# Patient Record
Sex: Female | Born: 2000
Health system: Southern US, Community
[De-identification: ages and names within clinical notes are randomized; demographics above are authoritative.]

## PROBLEM LIST (undated history)

## (undated) DIAGNOSIS — A64 Unspecified sexually transmitted disease: Secondary | ICD-10-CM

## (undated) DIAGNOSIS — Z8619 Personal history of other infectious and parasitic diseases: Secondary | ICD-10-CM

## (undated) HISTORY — DX: Unspecified sexually transmitted disease: A64

## (undated) HISTORY — PX: WISDOM TOOTH EXTRACTION: SHX21

## (undated) HISTORY — DX: Personal history of other infectious and parasitic diseases: Z86.19

---
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
## 2018-01-22 ENCOUNTER — Ambulatory Visit (INDEPENDENT_AMBULATORY_CARE_PROVIDER_SITE_OTHER): Payer: BLUE CROSS/BLUE SHIELD | Admitting: Family Medicine

## 2018-01-22 ENCOUNTER — Other Ambulatory Visit: Payer: Self-pay

## 2018-01-22 ENCOUNTER — Encounter: Payer: Self-pay | Admitting: Family Medicine

## 2018-01-22 VITALS — BP 110/78 | HR 56 | Temp 98.1°F | Resp 17 | Ht 61.0 in | Wt 112.5 lb

## 2018-01-22 DIAGNOSIS — Z0000 Encounter for general adult medical examination without abnormal findings: Secondary | ICD-10-CM

## 2018-01-22 DIAGNOSIS — Z23 Encounter for immunization: Secondary | ICD-10-CM | POA: Diagnosis not present

## 2018-01-22 NOTE — Patient Instructions (Signed)
Follow up in 1 year or as needed We can do your 2nd Men B shot at a nurse visit any time after the new year Keep up the good work!  You look great! Call with any questions or concerns Happy Holidays!!!

## 2018-01-22 NOTE — Progress Notes (Signed)
   Subjective:    Patient ID: Tara MaskerHailey R Matsushima, female    DOB: January 10, 2001, 17 y.o.   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  No concerns regarding health.  Currently dating and feels safe in relationship.  No concerns for STDs- declines testing.  Pt is due for Men B.  No drug use.  Occasional alcohol.  Not vaping or smoking   Review of Systems Patient reports no vision/ hearing changes, adenopathy,fever, weight change,  persistant/recurrent hoarseness , swallowing issues, chest pain, palpitations, edema, persistant/recurrent cough, hemoptysis, dyspnea (rest/exertional/paroxysmal nocturnal), gastrointestinal bleeding (melena, rectal bleeding), abdominal pain, significant heartburn, bowel changes, GU symptoms (dysuria, hematuria, incontinence), Gyn symptoms (abnormal  bleeding, pain),  syncope, focal weakness, memory loss, numbness & tingling, skin/hair/nail changes, abnormal bruising or bleeding, anxiety, or depression.     Objective:   Physical Exam General Appearance:    Alert, cooperative, no distress, appears stated age  Head:    Normocephalic, without obvious abnormality, atraumatic  Eyes:    PERRL, conjunctiva/corneas clear, EOM's intact, fundi    benign, both eyes  Ears:    Normal TM's and external ear canals, both ears  Nose:   Nares normal, septum midline, mucosa normal, no drainage    or sinus tenderness  Throat:   Lips, mucosa, and tongue normal; teeth and gums normal  Neck:   Supple, symmetrical, trachea midline, no adenopathy;    Thyroid: no enlargement/tenderness/nodules  Back:     Symmetric, no curvature, ROM normal, no CVA tenderness  Lungs:     Clear to auscultation bilaterally, respirations unlabored  Chest Wall:    No tenderness or deformity   Heart:    Regular rate and rhythm, S1 and S2 normal, no murmur, rub   or gallop  Breast Exam:    Deferred to GYN  Abdomen:     Soft, non-tender, bowel sounds active all four quadrants,    no  masses, no organomegaly  Genitalia:    Deferred to GYN  Rectal:    Extremities:   Extremities normal, atraumatic, no cyanosis or edema  Pulses:   2+ and symmetric all extremities  Skin:   Skin color, texture, turgor normal, no rashes or lesions  Lymph nodes:   Cervical, supraclavicular, and axillary nodes normal  Neurologic:   CNII-XII intact, normal strength, sensation and reflexes    throughout          Assessment & Plan:

## 2018-01-22 NOTE — Addendum Note (Signed)
Addended by: Geannie RisenBRODMERKEL, JESSICA L on: 01/22/2018 02:27 PM   Modules accepted: Orders

## 2018-01-22 NOTE — Assessment & Plan Note (Signed)
Pt's PE WNL.  UTD on GYN.  Men B given today.  No need for labs today.  Anticipatory guidance provided.

## 2018-04-21 DIAGNOSIS — Z113 Encounter for screening for infections with a predominantly sexual mode of transmission: Secondary | ICD-10-CM | POA: Diagnosis not present

## 2018-04-21 DIAGNOSIS — N760 Acute vaginitis: Secondary | ICD-10-CM | POA: Diagnosis not present

## 2018-06-02 ENCOUNTER — Ambulatory Visit (INDEPENDENT_AMBULATORY_CARE_PROVIDER_SITE_OTHER): Payer: BLUE CROSS/BLUE SHIELD | Admitting: Family Medicine

## 2018-06-02 ENCOUNTER — Other Ambulatory Visit: Payer: Self-pay

## 2018-06-02 ENCOUNTER — Encounter: Payer: Self-pay | Admitting: Family Medicine

## 2018-06-02 DIAGNOSIS — N3000 Acute cystitis without hematuria: Secondary | ICD-10-CM

## 2018-06-02 MED ORDER — NITROFURANTOIN MONOHYD MACRO 100 MG PO CAPS: 100.0000 mg | ORAL_CAPSULE | Freq: Two times a day (BID) | ORAL | 0 refills | Status: DC

## 2018-06-02 NOTE — Progress Notes (Signed)
I have discussed the procedure for the virtual visit with the patient who has given consent to proceed with assessment and treatment.   Zyair Rhein S Gaynelle Pastrana, CMA     

## 2018-06-02 NOTE — Patient Instructions (Signed)
Please follow up if symptoms do not improve or as needed.  Seek an urgent care if your symptoms are worsening so they can check your urine.    Urinary Tract Infection, Adult  A urinary tract infection (UTI) is an infection of any part of the urinary tract. The urinary tract includes the kidneys, ureters, bladder, and urethra. These organs make, store, and get rid of urine in the body. Your health care provider may use other names to describe the infection. An upper UTI affects the ureters and kidneys (pyelonephritis). A lower UTI affects the bladder (cystitis) and urethra (urethritis). What are the causes? Most urinary tract infections are caused by bacteria in your genital area, around the entrance to your urinary tract (urethra). These bacteria grow and cause inflammation of your urinary tract. What increases the risk? You are more likely to develop this condition if:  You have a urinary catheter that stays in place (indwelling).  You are not able to control when you urinate or have a bowel movement (you have incontinence).  You are female and you: ? Use a spermicide or diaphragm for birth control. ? Have low estrogen levels. ? Are pregnant.  You have certain genes that increase your risk (genetics).  You are sexually active.  You take antibiotic medicines.  You have a condition that causes your flow of urine to slow down, such as: ? An enlarged prostate, if you are female. ? Blockage in your urethra (stricture). ? A kidney stone. ? A nerve condition that affects your bladder control (neurogenic bladder). ? Not getting enough to drink, or not urinating often.  You have certain medical conditions, such as: ? Diabetes. ? A weak disease-fighting system (immunesystem). ? Sickle cell disease. ? Gout. ? Spinal cord injury. What are the signs or symptoms? Symptoms of this condition include:  Needing to urinate right away (urgently).  Frequent urination or passing small amounts  of urine frequently.  Pain or burning with urination.  Blood in the urine.  Urine that smells bad or unusual.  Trouble urinating.  Cloudy urine.  Vaginal discharge, if you are female.  Pain in the abdomen or the lower back. You may also have:  Vomiting or a decreased appetite.  Confusion.  Irritability or tiredness.  A fever.  Diarrhea. The first symptom in older adults may be confusion. In some cases, they may not have any symptoms until the infection has worsened. How is this diagnosed? This condition is diagnosed based on your medical history and a physical exam. You may also have other tests, including:  Urine tests.  Blood tests.  Tests for sexually transmitted infections (STIs). If you have had more than one UTI, a cystoscopy or imaging studies may be done to determine the cause of the infections. How is this treated? Treatment for this condition includes:  Antibiotic medicine.  Over-the-counter medicines to treat discomfort.  Drinking enough water to stay hydrated. If you have frequent infections or have other conditions such as a kidney stone, you may need to see a health care provider who specializes in the urinary tract (urologist). In rare cases, urinary tract infections can cause sepsis. Sepsis is a life-threatening condition that occurs when the body responds to an infection. Sepsis is treated in the hospital with IV antibiotics, fluids, and other medicines. Follow these instructions at home:  Medicines  Take over-the-counter and prescription medicines only as told by your health care provider.  If you were prescribed an antibiotic medicine, take it as told  by your health care provider. Do not stop using the antibiotic even if you start to feel better. General instructions  Make sure you: ? Empty your bladder often and completely. Do not hold urine for long periods of time. ? Empty your bladder after sex. ? Wipe from front to back after a bowel  movement if you are female. Use each tissue one time when you wipe.  Drink enough fluid to keep your urine pale yellow.  Keep all follow-up visits as told by your health care provider. This is important. Contact a health care provider if:  Your symptoms do not get better after 1-2 days.  Your symptoms go away and then return. Get help right away if you have:  Severe pain in your back or your lower abdomen.  A fever.  Nausea or vomiting. Summary  A urinary tract infection (UTI) is an infection of any part of the urinary tract, which includes the kidneys, ureters, bladder, and urethra.  Most urinary tract infections are caused by bacteria in your genital area, around the entrance to your urinary tract (urethra).  Treatment for this condition often includes antibiotic medicines.  If you were prescribed an antibiotic medicine, take it as told by your health care provider. Do not stop using the antibiotic even if you start to feel better.  Keep all follow-up visits as told by your health care provider. This is important. This information is not intended to replace advice given to you by your health care provider. Make sure you discuss any questions you have with your health care provider. Document Released: 11/22/2004 Document Revised: 08/22/2017 Document Reviewed: 08/22/2017 Elsevier Interactive Patient Education  2019 [employer].

## 2018-06-02 NOTE — Progress Notes (Signed)
     Virtual Visit via Video Note  Subjective  CC:  Chief Complaint  Patient presents with  . Urinary Tract Infection    Symptoms started 2 days. C/o buring and lower abdominal pain.    HPI:  I connected with Tara Ramirez on 06/02/18 at  1:00 PM EDT by a video enabled telemedicine application and verified that I am speaking with the correct person using two identifiers. Location patient: [place], college apartment Location provider: [employer], Office Persons participating in the virtual visit: [name], Willow Ora, MD [name], CMA   I discussed the limitations of evaluation and management by telemedicine and the availability of in person appointments. The patient expressed understanding and agreed to proceed. 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 First episode. No gyn sxs including vaginal d/c or pelvic pain. Eating fine. Feels otherwise well. She is in [place] so can't leave me a urine specimen. Trying to avoid urgent care due to covid -19 pandemic.   Assessment  1. Acute cystitis without hematuria      Plan   UTI:  Clinically consistent. macrobid x 5 days. Counseled on expectations and need for follow up if not improving. Patient understands and agrees with care plan. She doesn't report any upper tract sxs.   I discussed the assessment and treatment plan with the patient. The patient was provided an opportunity to ask questions and all were answered. The patient agreed with the plan and demonstrated an understanding of the instructions.   The patient was advised to call back or seek an in-person evaluation if the symptoms worsen or if the condition fails to improve as anticipated. Follow up: Return if symptoms worsen or fail to improve.  Visit date not found  Meds ordered this encounter   Medications  . nitrofurantoin, macrocrystal-monohydrate, (MACROBID) 100 MG capsule    Sig: Take 1 capsule (100 mg total) by mouth 2 (two) times daily.    Dispense:  10 capsule    Refill:  0      I reviewed the patients updated PMH, FH, and SocHx.    Patient Active Problem List   Diagnosis Date Noted  . Physical exam 10/11/2016  . COLLES' FRACTURE, RIGHT WRIST 01/05/2008   Current Meds  Medication Sig  . Norethindrone Acetate-Ethinyl Estrad-FE (JUNEL FE 24) 1-20 MG-MCG(24) tablet Take 1 tablet by mouth daily.    Allergies: Patient is allergic to penicillins. Family History: Patient family history includes Diabetes in her mother; Diabetes (age of onset: 23) in her brother; Esophageal cancer in her paternal grandfather; Thyroid disease in her mother. Social History:  Patient  reports that she has never smoked. She has never used smokeless tobacco. She reports current alcohol use. She reports that she does not use drugs.  Review of Systems: Constitutional: Negative for fever malaise or anorexia Cardiovascular: negative for chest pain Respiratory: negative for SOB or persistent cough Gastrointestinal: negative for abdominal pain  OBJECTIVE Vitals: There were no vitals taken for this visit.reports she is afebrile General: no acute distress , A&Ox3, appears well  Willow Ora, MD

## 2018-07-10 ENCOUNTER — Telehealth: Payer: BLUE CROSS/BLUE SHIELD | Admitting: Physician Assistant

## 2018-07-10 DIAGNOSIS — N760 Acute vaginitis: Secondary | ICD-10-CM | POA: Diagnosis not present

## 2018-07-10 DIAGNOSIS — B9689 Other specified bacterial agents as the cause of diseases classified elsewhere: Secondary | ICD-10-CM

## 2018-07-10 MED ORDER — METRONIDAZOLE 500 MG PO TABS: 500.0000 mg | ORAL_TABLET | Freq: Two times a day (BID) | ORAL | 0 refills | Status: DC

## 2018-07-10 NOTE — Progress Notes (Signed)

## 2018-07-10 NOTE — Progress Notes (Signed)
I have spent 5 minutes in review of e-visit questionnaire, review and updating patient chart, medical decision making and response to patient.   Vale Peraza Cody Nataline Basara, PA-C    

## 2018-10-14 ENCOUNTER — Other Ambulatory Visit: Payer: Self-pay

## 2018-10-14 MED ORDER — JUNEL FE 24 1-20 MG-MCG(24) PO TABS: 1.0000 | ORAL_TABLET | Freq: Every day | ORAL | 1 refills | Status: DC

## 2018-10-14 NOTE — Telephone Encounter (Signed)
Medication refill request: Junel Last AEX:  10/10/2017 Next AEX: 02/11/2019 Last MMG (if hormonal medication request): N/A Refill authorized: Please advise, order pended for 90 day with 1 refill if authorized. Pt's annual is not until December due to school schedule.

## 2018-10-26 DIAGNOSIS — Z20828 Contact with and (suspected) exposure to other viral communicable diseases: Secondary | ICD-10-CM | POA: Diagnosis not present

## 2018-12-26 DIAGNOSIS — Z113 Encounter for screening for infections with a predominantly sexual mode of transmission: Secondary | ICD-10-CM | POA: Diagnosis not present

## 2018-12-29 DIAGNOSIS — Z20828 Contact with and (suspected) exposure to other viral communicable diseases: Secondary | ICD-10-CM | POA: Diagnosis not present

## 2019-01-09 ENCOUNTER — Telehealth: Payer: BLUE CROSS/BLUE SHIELD | Admitting: Physician Assistant

## 2019-01-09 DIAGNOSIS — N760 Acute vaginitis: Secondary | ICD-10-CM

## 2019-01-09 MED ORDER — METRONIDAZOLE 500 MG PO TABS: 500.0000 mg | ORAL_TABLET | Freq: Two times a day (BID) | ORAL | 0 refills | Status: DC

## 2019-01-09 NOTE — Progress Notes (Signed)
I spent more than 5 min, but less than on this E-Visit.   We are sorry that you are not feeling well. Here is how we plan to help! Based on what you shared with me it looks like you: May have a vaginosis due to bacteria  Vaginosis is an inflammation of the vagina that can result in discharge, itching and pain. The cause is usually a change in the normal balance of vaginal bacteria or an infection. Vaginosis can also result from reduced estrogen levels after menopause.  The most common causes of vaginosis are:   Bacterial vaginosis which results from an overgrowth of one on several organisms that are normally present in your vagina.   Yeast infections which are caused by a naturally occurring fungus called candida.   Vaginal atrophy (atrophic vaginosis) which results from the thinning of the vagina from reduced estrogen levels after menopause.   Trichomoniasis which is caused by a parasite and is commonly transmitted by sexual intercourse.  Factors that increase your risk of developing vaginosis include: Marland Kitchen Medications, such as antibiotics and steroids . Uncontrolled diabetes . Use of hygiene products such as bubble bath, vaginal spray or vaginal deodorant . Douching . Wearing damp or tight-fitting clothing . Using an intrauterine device (IUD) for birth control . Hormonal changes, such as those associated with pregnancy, birth control pills or menopause . Sexual activity . Having a sexually transmitted infection  Your treatment plan is Metronidazole or Flagyl 500mg  twice a day for 7 days.  I have electronically sent this prescription into the pharmacy that you have chosen.  Be sure to take all of the medication as directed. Stop taking any medication if you develop a rash, tongue swelling or shortness of breath. Mothers who are breast feeding should consider pumping and discarding their breast milk while on these antibiotics. However, there is no consensus that infant exposure at  these doses would be harmful.  Remember that medication creams can weaken latex condoms.   HOME CARE:  Good hygiene may prevent some types of vaginosis from recurring and may relieve some symptoms:  . Avoid baths, hot tubs and whirlpool spas. Rinse soap from your outer genital area after a shower, and dry the area well to prevent irritation. Don't use scented or harsh soaps, such as those with deodorant or antibacterial action. Marland Kitchen Avoid irritants. These include scented tampons and pads. . Wipe from front to back after using the toilet. Doing so avoids spreading fecal bacteria to your vagina.  Other things that may help prevent vaginosis include:  Marland Kitchen Don't douche. Your vagina doesn't require cleansing other than normal bathing. Repetitive douching disrupts the normal organisms that reside in the vagina and can actually increase your risk of vaginal infection. Douching won't clear up a vaginal infection. . Use a latex condom. Both female and female latex condoms may help you avoid infections spread by sexual contact. . Wear cotton underwear. Also wear pantyhose with a cotton crotch. If you feel comfortable without it, skip wearing underwear to bed. Yeast thrives in Marland Kitchen Your symptoms should improve in the next day or two.  GET HELP RIGHT AWAY IF:  . You have pain in your lower abdomen ( pelvic area or over your ovaries) . You develop nausea or vomiting . You develop a fever . Your discharge changes or worsens . You have persistent pain with intercourse . You develop shortness of breath, a rapid pulse, or you faint.  These symptoms could be signs  of problems or infections that need to be evaluated by a medical provider now.  MAKE SURE YOU    Understand these instructions.  Will watch your condition.  Will get help right away if you are not doing well or get worse.  Your e-visit answers were reviewed by a board certified advanced clinical practitioner to complete your  personal care plan. Depending upon the condition, your plan could have included both over the counter or prescription medications. Please review your pharmacy choice to make sure that you have choses a pharmacy that is open for you to pick up any needed prescription, Your safety is important to [place]. If you have drug allergies check your prescription carefully.   You can use MyChart to ask questions about today's visit, request a non-urgent call back, or ask for a work or school excuse for 24 hours related to this e-Visit. If it has been greater than 24 hours you will need to follow up with your provider, or enter a new e-Visit to address those concerns. You will get a MyChart message within the next two days asking about your experience. I hope that your e-visit has been valuable and will speed your recovery.

## 2019-01-09 NOTE — Addendum Note (Signed)
Addended by: Waldon Merl on: 01/09/2019 07:18 PM   Modules accepted: Orders

## 2019-01-19 DIAGNOSIS — Z113 Encounter for screening for infections with a predominantly sexual mode of transmission: Secondary | ICD-10-CM | POA: Diagnosis not present

## 2019-02-09 DIAGNOSIS — Z20828 Contact with and (suspected) exposure to other viral communicable diseases: Secondary | ICD-10-CM | POA: Diagnosis not present

## 2019-02-10 NOTE — Progress Notes (Signed)
18 y.o. G0P0000 Single White or Caucasian Not Hispanic or Latino female here for annual exam.  Sexually active, no current partner. Uses condoms. No pain. On the pill, doing well.   Period Cycle (Days): 28 Period Duration (Days): 4 days Period Pattern: Regular Menstrual Flow: Light, Moderate Menstrual Control: Tampon Menstrual Control Change Freq (Hours): chaning every 4-6 hours Dysmenorrhea: None  Patient's last menstrual period was 01/30/2019 (exact date).          Sexually active: Yes.    The current method of family planning is OCP's and condoms most of the time.    Exercising: Yes.    weights, running Smoker:  no  Health Maintenance: Pap:  none History of abnormal Pap:  no MMG:  none Colonoscopy:  none BMD:   none TDaP:  10/25/2008 Gardasil: 06/09/2013, 05/29/2012, 05/09/2011   reports that she has never smoked. She has never used smokeless tobacco. She reports current alcohol use. She reports that she does not use drugs. Drinks 1 drink one time a week. 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   Past Medical History:  Diagnosis Date  . History of chlamydia   . STD (sexually transmitted disease)     Past Surgical History:  Procedure Laterality Date  . gum graft surgery  09/30/2017  . WISDOM TOOTH EXTRACTION      Current Outpatient Medications  Medication Sig Dispense Refill  . Norethindrone Acetate-Ethinyl Estrad-FE (JUNEL FE 24) 1-20 MG-MCG(24) tablet Take 1 tablet by mouth daily. (Patient not taking: Reported on 02/11/2019) 84 tablet 1   No current facility-administered medications for this visit.    Family History  Problem Relation Age of Onset  . Diabetes Mother        type 1  . Thyroid disease Mother        goiters  . Diabetes Brother 13       type 1  . Esophageal cancer Paternal Grandfather     Review of Systems  Constitutional: Negative.   HENT: Negative.   Eyes: Negative.   Respiratory:  Negative.   Cardiovascular: Negative.   Gastrointestinal: Negative.   Endocrine: Negative.   Genitourinary: Negative.   Musculoskeletal: Negative.   Skin: Negative.   Allergic/Immunologic: Negative.   Neurological: Negative.   Hematological: Negative.   Psychiatric/Behavioral: Negative.     Exam:   BP 130/76 (BP Location: Right Arm, Patient Position: Sitting, Cuff Size: Normal)   Pulse 64   Temp (!) 97.4 F (36.3 C) (Skin)   Wt 118 lb (53.5 kg)   LMP 01/30/2019 (Exact Date)   BMI 22.30 kg/m   Weight change: @WEIGHTCHANGE @ Height:      Ht Readings from Last 3 Encounters:  01/22/18 5\' 1"  (1.549 m) (10 %, Z= -1.29)*  10/10/17 5' 0.25" (1.53 m) (6 %, Z= -1.58)*  10/11/16 5\' 1"  (1.549 m) (10 %, Z= -1.27)*   * Growth percentiles are based on CDC (Girls, 2-20 Years) data.    General appearance: alert, cooperative and appears stated age Head: Normocephalic, without obvious abnormality, atraumatic Neck: no adenopathy, supple, symmetrical, trachea midline and thyroid normal to inspection and palpation Lungs: clear to auscultation bilaterally Cardiovascular: regular rate and rhythm Breasts: normal appearance, no masses or tenderness Abdomen: soft, non-tender; non distended,  no masses,  no organomegaly Extremities: extremities normal, atraumatic, no cyanosis or edema Skin: Skin color, texture, turgor normal. No rashes or lesions Lymph nodes: Cervical, supraclavicular, and axillary nodes normal.  No abnormal inguinal nodes palpated Neurologic: Grossly normal   Pelvic: External genitalia:  no lesions              Urethra:  normal appearing urethra with no masses, tenderness or lesions              Bartholins and Skenes: normal                 Vagina: normal appearing vagina with normal color and discharge, no lesions              Cervix: no lesions               Bimanual Exam:  Uterus:  normal size, contour, position, consistency, mobility, non-tender and anteverted               Adnexa: no mass, fullness, tenderness               Rectovaginal: deferred  Kaitlyn Sprague chaperoned for the exam.  A:  Well Woman with normal exam  Doing well on OCP's  H/O Chlamydia last fall, asymptomatic  P:   No pap this year  TDAP today  STD testing  Screening labs  Continue OCP's  Discussed breast self exam  Discussed calcium and vit D intake  Discussed Chlamydia and risk of tubal scarring and ectopic pregnancy, information given

## 2019-02-11 ENCOUNTER — Ambulatory Visit (INDEPENDENT_AMBULATORY_CARE_PROVIDER_SITE_OTHER): Payer: 59 | Admitting: Obstetrics and Gynecology

## 2019-02-11 ENCOUNTER — Other Ambulatory Visit: Payer: Self-pay

## 2019-02-11 ENCOUNTER — Encounter: Payer: Self-pay | Admitting: Obstetrics and Gynecology

## 2019-02-11 VITALS — BP 130/76 | HR 64 | Temp 97.4°F | Wt 118.0 lb

## 2019-02-11 DIAGNOSIS — Z113 Encounter for screening for infections with a predominantly sexual mode of transmission: Secondary | ICD-10-CM

## 2019-02-11 DIAGNOSIS — Z8619 Personal history of other infectious and parasitic diseases: Secondary | ICD-10-CM

## 2019-02-11 DIAGNOSIS — Z23 Encounter for immunization: Secondary | ICD-10-CM

## 2019-02-11 DIAGNOSIS — Z01419 Encounter for gynecological examination (general) (routine) without abnormal findings: Secondary | ICD-10-CM | POA: Diagnosis not present

## 2019-02-11 DIAGNOSIS — Z833 Family history of diabetes mellitus: Secondary | ICD-10-CM | POA: Diagnosis not present

## 2019-02-11 DIAGNOSIS — Z0000 Encounter for general adult medical examination without abnormal findings: Secondary | ICD-10-CM

## 2019-02-11 MED ORDER — JUNEL FE 24 1-20 MG-MCG(24) PO TABS: 1.0000 | ORAL_TABLET | Freq: Every day | ORAL | 3 refills | Status: DC

## 2019-02-11 NOTE — Patient Instructions (Signed)
EXERCISE AND DIET:  We recommended that you start or continue a regular exercise program for good health. Regular exercise means any activity that makes your heart beat faster and makes you sweat.  We recommend exercising at least 30 minutes per day at least 3 days a week, preferably 4 or 5.  We also recommend a diet low in fat and sugar.  Inactivity, poor dietary choices and obesity can cause diabetes, heart attack, stroke, and kidney damage, among others.    ALCOHOL AND SMOKING:  Women should limit their alcohol intake to no more than 7 drinks/beers/glasses of wine (combined, not each!) per week. Moderation of alcohol intake to this level decreases your risk of breast cancer and liver damage. And of course, no recreational drugs are part of a healthy lifestyle.  And absolutely no smoking or even second hand smoke. Most people know smoking can cause heart and lung diseases, but did you know it also contributes to weakening of your bones? Aging of your skin?  Yellowing of your teeth and nails?  CALCIUM AND VITAMIN D:  Adequate intake of calcium and Vitamin D are recommended.  The recommendations for exact amounts of these supplements seem to change often, but generally speaking 1,000 mg of calcium (between diet and supplement) and 800 units of Vitamin D per day seems prudent. Certain women may benefit from higher intake of Vitamin D.  If you are among these women, your doctor will have told you during your visit.    PAP SMEARS:  Pap smears, to check for cervical cancer or precancers,  have traditionally been done yearly, although recent scientific advances have shown that most women can have pap smears less often.  However, every woman still should have a physical exam from her gynecologist every year. It will include a breast check, inspection of the vulva and vagina to check for abnormal growths or skin changes, a visual exam of the cervix, and then an exam to evaluate the size and shape of the uterus and  ovaries.  And after 20 years of age, a rectal exam is indicated to check for rectal cancers. We will also provide age appropriate advice regarding health maintenance, like when you should have certain vaccines, screening for sexually transmitted diseases, bone density testing, colonoscopy, mammograms, etc.   MAMMOGRAMS:  All women over 40 years old should have a yearly mammogram. Many facilities now offer a "3D" mammogram, which may cost around $50 extra out of pocket. If possible,  we recommend you accept the option to have the 3D mammogram performed.  It both reduces the number of women who will be called back for extra views which then turn out to be normal, and it is better than the routine mammogram at detecting truly abnormal areas.    COLON CANCER SCREENING: Now recommend starting at age 45. At this time colonoscopy is not covered for routine screening until 50. There are take home tests that can be done between 45-49.   COLONOSCOPY:  Colonoscopy to screen for colon cancer is recommended for all women at age 50.  We know, you hate the idea of the prep.  We agree, BUT, having colon cancer and not knowing it is worse!!  Colon cancer so often starts as a polyp that can be seen and removed at colonscopy, which can quite literally save your life!  And if your first colonoscopy is normal and you have no family history of colon cancer, most women don't have to have it again for   10 years.  Once every ten years, you can do something that may end up saving your life, right?  We will be happy to help you get it scheduled when you are ready.  Be sure to check your insurance coverage so you understand how much it will cost.  It may be covered as a preventative service at no cost, but you should check your particular policy.      Breast Self-Awareness Breast self-awareness means being familiar with how your breasts look and feel. It involves checking your breasts regularly and reporting any changes to your  health care provider. Practicing breast self-awareness is important. A change in your breasts can be a sign of a serious medical problem. Being familiar with how your breasts look and feel allows you to find any problems early, when treatment is more likely to be successful. All women should practice breast self-awareness, including women who have had breast implants. How to do a breast self-exam One way to learn what is normal for your breasts and whether your breasts are changing is to do a breast self-exam. To do a breast self-exam: Look for Changes  1. Remove all the clothing above your waist. 2. Stand in front of a mirror in a room with good lighting. 3. Put your hands on your hips. 4. Push your hands firmly downward. 5. Compare your breasts in the mirror. Look for differences between them (asymmetry), such as: ? Differences in shape. ? Differences in size. ? Puckers, dips, and bumps in one breast and not the other. 6. Look at each breast for changes in your skin, such as: ? Redness. ? Scaly areas. 7. Look for changes in your nipples, such as: ? Discharge. ? Bleeding. ? Dimpling. ? Redness. ? A change in position. Feel for Changes Carefully feel your breasts for lumps and changes. It is best to do this while lying on your back on the floor and again while sitting or standing in the shower or tub with soapy water on your skin. Feel each breast in the following way:  Place the arm on the side of the breast you are examining above your head.  Feel your breast with the other hand.  Start in the nipple area and make  inch (2 cm) overlapping circles to feel your breast. Use the pads of your three middle fingers to do this. Apply light pressure, then medium pressure, then firm pressure. The light pressure will allow you to feel the tissue closest to the skin. The medium pressure will allow you to feel the tissue that is a little deeper. The firm pressure will allow you to feel the tissue  close to the ribs.  Continue the overlapping circles, moving downward over the breast until you feel your ribs below your breast.  Move one finger-width toward the center of the body. Continue to use the  inch (2 cm) overlapping circles to feel your breast as you move slowly up toward your collarbone.  Continue the up and down exam using all three pressures until you reach your armpit.  Write Down What You Find  Write down what is normal for each breast and any changes that you find. Keep a written record with breast changes or normal findings for each breast. By writing this information down, you do not need to depend only on memory for size, tenderness, or location. Write down where you are in your menstrual cycle, if you are still menstruating. If you are having trouble noticing differences   in your breasts, do not get discouraged. With time you will become more familiar with the variations in your breasts and more comfortable with the exam. How often should I examine my breasts? Examine your breasts every month. If you are breastfeeding, the best time to examine your breasts is after a feeding or after using a breast pump. If you menstruate, the best time to examine your breasts is 5-7 days after your period is over. During your period, your breasts are lumpier, and it may be more difficult to notice changes. When should I see my health care provider? See your health care provider if you notice:  A change in shape or size of your breasts or nipples.  A change in the skin of your breast or nipples, such as a reddened or scaly area.  Unusual discharge from your nipples.  A lump or thick area that was not there before.  Pain in your breasts.  Anything that concerns you.   Chlamydia, Female Chlamydia is an STD (sexually transmitted disease). It is a bacterial infection that spreads (is contagious) through sexual contact. Chlamydia can occur in different areas of the body,  including:  The tube that moves urine from the bladder out of the body (urethra).  The lower part of the uterus (cervix).  The throat.  The rectum. This condition is not difficult to treat. However, if left untreated, chlamydia can lead to more serious health problems, including pelvic inflammatory disorder (PID). PID can increase your risk of not being able to have children (sterility). Also, if chlamydia is left untreated and you are pregnant or become pregnant, there is a chance that your baby can become infected during delivery. This may cause serious health problems for the baby. What are the causes? Chlamydia is caused by the bacteria Chlamydia trachomatis. It is passed from an infected partner during sexual activity. Chlamydia can spread through contact with the genitals, mouth, or rectum. What are the signs or symptoms? In some cases, there may not be any symptoms for this condition (asymptomatic), especially early in the infection. If symptoms develop, they may include:  Burning with urination.  Frequent urination.  Vaginal discharge.  Redness, soreness, and swelling (inflammation) of the rectum.  Bleeding or discharge from the rectum.  Abdominal pain.  Pain during sexual intercourse.  Bleeding between menstrual periods.  Itching, burning, or redness in the eyes, or discharge from the eyes. How is this diagnosed? This condition may be diagnosed with:  Urine tests.  Swab tests. Depending on your symptoms, your health care provider may use a cotton swab to collect discharge from your vagina or rectum to test for the bacteria.  A pelvic exam. How is this treated? This condition is treated with antibiotic medicines. If you are pregnant, certain types of antibiotics will need to be avoided. Follow these instructions at home: Medicines  Take over-the-counter and prescription medicines only as told by your health care provider.  Take your antibiotic medicine as told  by your health care provider. Do not stop taking the antibiotic even if you start to feel better. Sexual activity  Tell sexual partners about your infection. This includes any oral, anal, or vaginal sex partners you have had within 60 days of when your symptoms started. Sexual partners should also be treated, even if they have no signs of the disease.  Do not have sex until you and your sexual partners have completed treatment and your health care provider says it is okay. If your health   care provider prescribed you a single dose treatment, wait 7 days after taking the treatment before having sex. General instructions  It is your responsibility to get your test results. Ask your health care provider, or the department performing the test, when your results will be ready.  Get plenty of rest.  Eat a healthy, well-balanced diet.  Drink enough fluids to keep your urine clear or pale yellow.  Keep all follow-up visits as told by your health care provider. This is important. You may need to be tested for infection again 3 months after treatment. How is this prevented? The only sure way to prevent chlamydia is to avoid having sex. However, you can lower your risk by:  Using latex condoms correctly every time you have sex.  Not having multiple sexual partners.  Asking if your sexual partner has been tested for STIs and had negative results. Contact a health care provider if:  You develop new symptoms or your symptoms do not get better after completing treatment.  You have a fever or chills.  You have pain during sexual intercourse. Get help right away if:  Your pain gets worse and does not get better with medicine.  You develop flu-like symptoms, such as night sweats, sore throat, or muscle aches.  You experience nausea or vomiting.  You have difficulty swallowing.  You have bleeding between periods or after sex.  You have irregular menstrual periods.  You have abdominal or  lower back pain that does not get better with medicine.  You feel weak or dizzy, or you faint.  You are pregnant and you develop symptoms of chlamydia. Summary  Chlamydia is an STD (sexually transmitted disease). It is a bacterial infection that spreads (is contagious) through sexual contact.  This condition is not difficult to treat, however. If left untreated, chlamydia can lead to more serious health problems, including pelvic inflammatory disease (PID).  In some cases, there may not be any symptoms for this condition (asymptomatic).  This condition is treated with antibiotic medicines.  Using latex condoms correctly every time you have sex can help prevent chlamydia. This information is not intended to replace advice given to you by your health care provider. Make sure you discuss any questions you have with your health care provider. Document Released: 11/22/2004 Document Revised: 08/06/2017 Document Reviewed: 01/30/2016 Elsevier Patient Education  2020 [employer]  

## 2019-02-12 LAB — CBC
Hematocrit: 41.8 % (ref 34.0–46.6)
Hemoglobin: 14.3 g/dL (ref 11.1–15.9)
MCH: 30.6 pg (ref 26.6–33.0)
MCHC: 34.2 g/dL (ref 31.5–35.7)
MCV: 90 fL (ref 79–97)
Platelets: 445 10*3/uL (ref 150–450)
RBC: 4.67 x10E6/uL (ref 3.77–5.28)
RDW: 12 % (ref 11.7–15.4)
WBC: 8.6 10*3/uL (ref 3.4–10.8)

## 2019-02-12 LAB — COMPREHENSIVE METABOLIC PANEL
ALT: 25 IU/L (ref 0–32)
AST: 38 IU/L (ref 0–40)
Albumin/Globulin Ratio: 1.3 (ref 1.2–2.2)
Albumin: 4.1 g/dL (ref 3.9–5.0)
Alkaline Phosphatase: 88 IU/L (ref 39–117)
BUN/Creatinine Ratio: 29 — ABNORMAL HIGH (ref 9–23)
BUN: 20 mg/dL (ref 6–20)
Bilirubin Total: 0.3 mg/dL (ref 0.0–1.2)
CO2: 23 mmol/L (ref 20–29)
Calcium: 9.8 mg/dL (ref 8.7–10.2)
Chloride: 101 mmol/L (ref 96–106)
Creatinine, Ser: 0.68 mg/dL (ref 0.57–1.00)
GFR calc Af Amer: 146 mL/min/{1.73_m2} (ref >59)
GFR calc non Af Amer: 126 mL/min/{1.73_m2} (ref >59)
Globulin, Total: 3.1 g/dL (ref 1.5–4.5)
Glucose: 84 mg/dL (ref 65–99)
Potassium: 4.1 mmol/L (ref 3.5–5.2)
Sodium: 138 mmol/L (ref 134–144)
Total Protein: 7.2 g/dL (ref 6.0–8.5)

## 2019-02-12 LAB — LIPID PANEL
Chol/HDL Ratio: 2.5 ratio (ref 0.0–4.4)
Cholesterol, Total: 206 mg/dL — ABNORMAL HIGH (ref 100–199)
HDL: 84 mg/dL (ref >39)
LDL Chol Calc (NIH): 96 mg/dL (ref 0–99)
Triglycerides: 153 mg/dL — ABNORMAL HIGH (ref 0–149)
VLDL Cholesterol Cal: 26 mg/dL (ref 5–40)

## 2019-02-12 LAB — HEP, RPR, HIV PANEL
HIV Screen 4th Generation wRfx: NONREACTIVE
Hepatitis B Surface Ag: NEGATIVE
RPR Ser Ql: NONREACTIVE

## 2019-02-12 LAB — CHLAMYDIA/GONOCOCCUS/TRICHOMONAS, NAA
Chlamydia by NAA: NEGATIVE
Gonococcus by NAA: NEGATIVE
Trich vag by NAA: NEGATIVE

## 2019-02-12 LAB — HEPATITIS C ANTIBODY: Hep C Virus Ab: 0.1 s/co ratio (ref 0.0–0.9)

## 2019-02-12 LAB — HEMOGLOBIN A1C
Est. average glucose Bld gHb Est-mCnc: 97 mg/dL
Hgb A1c MFr Bld: 5 % (ref 4.8–5.6)

## 2019-02-18 DIAGNOSIS — Z20828 Contact with and (suspected) exposure to other viral communicable diseases: Secondary | ICD-10-CM | POA: Diagnosis not present

## 2019-04-03 ENCOUNTER — Telehealth: Payer: Self-pay | Admitting: Obstetrics and Gynecology

## 2019-04-03 NOTE — Telephone Encounter (Signed)
Rx for Junel Fe 24 sent on 02/11/19, #[id number]  Call placed to [facility], spoke with Marchelle Folks. Confirmed 3 refills on file.   Call returned to patient, advised as seen above. F/u with pharmacy for filling Rx.   Routing to provider for final review. Patient is agreeable to disposition. Will close encounter.

## 2019-04-03 NOTE — Telephone Encounter (Signed)
Patient is calling regarding refill for northindrone. Patient stated that she was given 3 refills in December, but the pharmacy is saying that they need authorization to refill prescription.

## 2019-05-05 DIAGNOSIS — Z23 Encounter for immunization: Secondary | ICD-10-CM | POA: Diagnosis not present

## 2019-05-11 ENCOUNTER — Encounter: Payer: Self-pay | Admitting: Certified Nurse Midwife

## 2019-05-13 ENCOUNTER — Encounter: Payer: Self-pay | Admitting: Certified Nurse Midwife

## 2019-07-06 ENCOUNTER — Telehealth: Payer: Self-pay | Admitting: Obstetrics and Gynecology

## 2019-07-06 NOTE — Telephone Encounter (Signed)
Patient concerned with bleeding after intercourse.

## 2019-07-06 NOTE — Telephone Encounter (Signed)
AEX 01/2019 H/O +Chlamydia per pt, not in Epic Neg CHL/GC 01/2019 OCPs-Junel   Spoke with patient. Pt states having bleeding/spotting during mid intercourse until a couple days after. Pt describes bleeding as starting out red, then to light pink/brown. Pt states has been happening over last couple months. Pt only wearing a panty liner and changing once per day. Pt denies vaginal odor, discharge, fever, chills. Pt states using condoms and is on OCPs. Pt denies skipping or missing any pills.  Pt states currently has 2 partners and the coital bleeding is only occurring intermittently with which partner she has intercourse.  Pt advised to be seen for further evaluation. Pt agreeable.  Pt scheduled for OV with Dr Oscar La on 5/11 at 4pm. Pt verbalized understanding. CPS Neg.   Routing to Dr Oscar La for review.  Encounter closed.

## 2019-07-07 ENCOUNTER — Other Ambulatory Visit: Payer: Self-pay

## 2019-07-07 ENCOUNTER — Other Ambulatory Visit (HOSPITAL_COMMUNITY)
Admission: RE | Admit: 2019-07-07 | Discharge: 2019-07-07 | Disposition: A | Discharge status: Home / Self Care | Payer: 59 | Source: Ambulatory Visit | Attending: Obstetrics and Gynecology | Admitting: Obstetrics and Gynecology

## 2019-07-07 ENCOUNTER — Encounter: Payer: Self-pay | Admitting: Obstetrics and Gynecology

## 2019-07-07 ENCOUNTER — Ambulatory Visit (INDEPENDENT_AMBULATORY_CARE_PROVIDER_SITE_OTHER): Payer: 59 | Admitting: Obstetrics and Gynecology

## 2019-07-07 VITALS — BP 110/62 | HR 68 | Temp 98.8°F | Ht 60.0 in | Wt 122.0 lb

## 2019-07-07 DIAGNOSIS — Z124 Encounter for screening for malignant neoplasm of cervix: Secondary | ICD-10-CM | POA: Insufficient documentation

## 2019-07-07 DIAGNOSIS — N930 Postcoital and contact bleeding: Secondary | ICD-10-CM

## 2019-07-07 DIAGNOSIS — Z113 Encounter for screening for infections with a predominantly sexual mode of transmission: Secondary | ICD-10-CM | POA: Diagnosis not present

## 2019-07-07 DIAGNOSIS — N3090 Cystitis, unspecified without hematuria: Secondary | ICD-10-CM

## 2019-07-07 LAB — POCT URINALYSIS DIPSTICK
Bilirubin, UA: NEGATIVE
Glucose, UA: NEGATIVE
Ketones, UA: NORMAL
Nitrite, UA: NEGATIVE
Protein, UA: POSITIVE — AB
Urobilinogen, UA: NEGATIVE E.U./dL — AB
pH, UA: 6 (ref 5.0–8.0)

## 2019-07-07 MED ORDER — PHENAZOPYRIDINE HCL 200 MG PO TABS
200.0000 mg | ORAL_TABLET | Freq: Three times a day (TID) | ORAL | 0 refills | Status: DC | PRN
Start: 2019-07-07 — End: 2019-07-14

## 2019-07-07 MED ORDER — SULFAMETHOXAZOLE-TRIMETHOPRIM 800-160 MG PO TABS
1.0000 | ORAL_TABLET | Freq: Two times a day (BID) | ORAL | 0 refills | Status: DC
Start: 2019-07-07 — End: 2019-07-14

## 2019-07-07 NOTE — Patient Instructions (Signed)

## 2019-07-07 NOTE — Progress Notes (Signed)
GYNECOLOGY  VISIT   HPI: 19 y.o.   Single White or Caucasian Not Hispanic or Latino  female   G0P0000 with Patient's last menstrual period was 05/20/2019 (approximate).   here for bleeding after sex. Patient states that she sex Sunday evening and there was blood. She says that it continued into Monday and having burning with urination.    She has occasional bleeding after sex for the last 2 years. She has several current partners. She had a new partner and it happened x 1. She bleed on the bed, not painful, no tearing. Using condoms every time.  No vaginitis symptoms.  She c/o dysuria since yesterday, she has had some increased frequency of urination since Sunday, no urgency to void.   She is on OCP's, giving herself a cycle every 2-3 months. Bleeds x 4-5 days, no intermenstrual bleeding. No cramps, saturates a super + tampon in 4-5 hours.   GYNECOLOGIC HISTORY: Patient's last menstrual period was 05/20/2019 (approximate). Contraception: junel  Menopausal hormone therapy: none        OB History    Gravida  0   Para  0   Term  0   Preterm  0   AB  0   Living  0     SAB  0   TAB  0   Ectopic  0   Multiple  0   Live Births  0              Patient Active Problem List   Diagnosis Date Noted  . History of chlamydia   . Physical exam 10/11/2016  . COLLES' FRACTURE, RIGHT WRIST 01/05/2008    Past Medical History:  Diagnosis Date  . History of chlamydia   . STD (sexually transmitted disease)     Past Surgical History:  Procedure Laterality Date  . gum graft surgery  09/30/2017  . WISDOM TOOTH EXTRACTION      Current Outpatient Medications  Medication Sig Dispense Refill  . Norethindrone Acetate-Ethinyl Estrad-FE (JUNEL FE 24) 1-20 MG-MCG(24) tablet Take 1 tablet by mouth daily. 84 tablet 3   No current facility-administered medications for this visit.     ALLERGIES: Penicillins  Family History  Problem Relation Age of Onset  . Diabetes Mother    type 1  . Thyroid disease Mother        goiters  . Diabetes Brother 13       type 1  . Esophageal cancer Paternal Grandfather     Social History   Socioeconomic History  . Marital status: Single    Spouse name: Not on file  . Number of children: Not on file  . Years of education: Not on file  . Highest education level: Not on file  Occupational History  . Not on file  Tobacco Use  . Smoking status: Never Smoker  . Smokeless tobacco: Never Used  Substance and Sexual Activity  . Alcohol use: Yes  . Drug use: No  . Sexual activity: Yes    Partners: Male    Birth control/protection: Condom, Pill  Other Topics Concern  . Not on file  Social History Narrative  . Not on file   Social Determinants of Health   Financial Resource Strain:   . Difficulty of Paying Living Expenses:   Food Insecurity:   . Worried About Charity fundraiser in the Last Year:   . Arboriculturist in the Last Year:   Transportation Needs:   . Lack  of Transportation (Medical):   Marland Kitchen Lack of Transportation (Non-Medical):   Physical Activity:   . Days of Exercise per Week:   . Minutes of Exercise per Session:   Stress:   . Feeling of Stress :   Social Connections:   . Frequency of Communication with Friends and Family:   . Frequency of Social Gatherings with Friends and Family:   . Attends Religious Services:   . Active Member of Clubs or Organizations:   . Attends Banker Meetings:   Marland Kitchen Marital Status:   Intimate Partner Violence:   . Fear of Current or Ex-Partner:   . Emotionally Abused:   Marland Kitchen Physically Abused:   . Sexually Abused:     Review of Systems  Genitourinary: Positive for dysuria and frequency.  All other systems reviewed and are negative.   PHYSICAL EXAMINATION:    BP 110/62   Pulse 68   Temp 98.8 F (37.1 C)   Ht 5' (1.524 m)   Wt 122 lb (55.3 kg)   LMP 05/20/2019 (Approximate)   SpO2 98%   BMI 23.83 kg/m     General appearance: alert, cooperative and  appears stated age   Pelvic: External genitalia:  no lesions              Urethra:  normal appearing urethra with no masses, tenderness or lesions              Bartholins and Skenes: normal                 Vagina: normal appearing vagina with normal color and discharge, no lesions              Cervix: no lesions and friable              Bimanual Exam:  Uterus:  normal size, contour, position, consistency, mobility, non-tender              Adnexa: no mass, fullness, tenderness               Chaperone was present for exam.  ASSESSMENT Post coital bleeding, mildly friable cervix otherwise normal exam UTI    PLAN Pap Nuswab for GC/CT/trich Treat UTI with bactrim and pyridium Send urine for ua, c&s

## 2019-07-10 ENCOUNTER — Encounter: Payer: Self-pay | Admitting: Obstetrics and Gynecology

## 2019-07-10 LAB — CHLAMYDIA/GONOCOCCUS/TRICHOMONAS, NAA
Chlamydia by NAA: NEGATIVE
Gonococcus by NAA: NEGATIVE
Trich vag by NAA: NEGATIVE

## 2019-07-10 LAB — CYTOLOGY - PAP: Diagnosis: NEGATIVE

## 2019-07-13 ENCOUNTER — Other Ambulatory Visit: Payer: Self-pay

## 2019-07-13 NOTE — Telephone Encounter (Signed)
Called and spoke with patient. Scheduled her for 10:45 tomorrow 07/13/19 with Dr. Oscar La. She states that her UTI is not better.

## 2019-07-14 ENCOUNTER — Encounter: Payer: Self-pay | Admitting: Obstetrics and Gynecology

## 2019-07-14 ENCOUNTER — Ambulatory Visit (INDEPENDENT_AMBULATORY_CARE_PROVIDER_SITE_OTHER): Payer: 59 | Admitting: Obstetrics and Gynecology

## 2019-07-14 VITALS — HR 73 | Temp 98.1°F

## 2019-07-14 DIAGNOSIS — N3090 Cystitis, unspecified without hematuria: Secondary | ICD-10-CM

## 2019-07-14 DIAGNOSIS — R310 Gross hematuria: Secondary | ICD-10-CM

## 2019-07-14 MED ORDER — PHENAZOPYRIDINE HCL 200 MG PO TABS: 200.0000 mg | ORAL_TABLET | Freq: Three times a day (TID) | ORAL | 0 refills | Status: DC | PRN

## 2019-07-14 MED ORDER — NITROFURANTOIN MONOHYD MACRO 100 MG PO CAPS
100.0000 mg | ORAL_CAPSULE | Freq: Two times a day (BID) | ORAL | 0 refills | Status: DC
Start: 2019-07-14 — End: 2019-11-11

## 2019-07-14 NOTE — Progress Notes (Signed)
GYNECOLOGY  VISIT   HPI: 19 y.o.   Single White or Caucasian Not Hispanic or Latino  female   G0P0000 with No LMP recorded.   here for UTI. Patient complains of still having burning with urination and some frequency.   Patient states that after she run she pees blood.  She was seen on 07/07/19 she was treated with bactrim, it helped a little but symptoms aren't gone. The frequency and urgency aren't as bad, the burning is still there, some days worse.  She has seen some blood in her urine after running.   GYNECOLOGIC HISTORY: No LMP recorded. Contraception:OCP Menopausal hormone therapy: n/a        OB History    Gravida  0   Para  0   Term  0   Preterm  0   AB  0   Living  0     SAB  0   TAB  0   Ectopic  0   Multiple  0   Live Births  0              Patient Active Problem List   Diagnosis Date Noted  . History of chlamydia   . Physical exam 10/11/2016  . COLLES' FRACTURE, RIGHT WRIST 01/05/2008    Past Medical History:  Diagnosis Date  . History of chlamydia   . STD (sexually transmitted disease)     Past Surgical History:  Procedure Laterality Date  . gum graft surgery  09/30/2017  . WISDOM TOOTH EXTRACTION      Current Outpatient Medications  Medication Sig Dispense Refill  . Norethindrone Acetate-Ethinyl Estrad-FE (JUNEL FE 24) 1-20 MG-MCG(24) tablet Take 1 tablet by mouth daily. 84 tablet 3  . phenazopyridine (PYRIDIUM) 200 MG tablet Take 1 tablet (200 mg total) by mouth 3 (three) times daily as needed. 6 tablet 0  . sulfamethoxazole-trimethoprim (BACTRIM DS) 800-160 MG tablet Take 1 tablet by mouth 2 (two) times daily. One PO BID x 3 days 6 tablet 0   No current facility-administered medications for this visit.     ALLERGIES: Penicillins  Family History  Problem Relation Age of Onset  . Diabetes Mother        type 1  . Thyroid disease Mother        goiters  . Diabetes Brother 13       type 1  . Esophageal cancer Paternal  Grandfather     Social History   Socioeconomic History  . Marital status: Single    Spouse name: Not on file  . Number of children: Not on file  . Years of education: Not on file  . Highest education level: Not on file  Occupational History  . Not on file  Tobacco Use  . Smoking status: Never Smoker  . Smokeless tobacco: Never Used  Substance and Sexual Activity  . Alcohol use: Yes  . Drug use: No  . Sexual activity: Yes    Partners: Male    Birth control/protection: Condom, Pill  Other Topics Concern  . Not on file  Social History Narrative  . Not on file   Social Determinants of Health   Financial Resource Strain:   . Difficulty of Paying Living Expenses:   Food Insecurity:   . Worried About Programme researcher, broadcasting/film/video in the Last Year:   . Barista in the Last Year:   Transportation Needs:   . Freight forwarder (Medical):   Marland Kitchen Lack of Transportation (  Non-Medical):   Physical Activity:   . Days of Exercise per Week:   . Minutes of Exercise per Session:   Stress:   . Feeling of Stress :   Social Connections:   . Frequency of Communication with Friends and Family:   . Frequency of Social Gatherings with Friends and Family:   . Attends Religious Services:   . Active Member of Clubs or Organizations:   . Attends [name] Meetings:   Marland Kitchen Marital Status:   Intimate Partner Violence:   . Fear of Current or Ex-Partner:   . Emotionally Abused:   Marland Kitchen Physically Abused:   . Sexually Abused:     Review of Systems  Constitutional: Negative.   HENT: Negative.   Eyes: Negative.   Respiratory: Negative.   Cardiovascular: Negative.   Gastrointestinal: Negative.   Genitourinary: Positive for frequency.       Burning with urination  Musculoskeletal: Negative.   Skin: Negative.   Neurological: Negative.   Endo/Heme/Allergies: Negative.   Psychiatric/Behavioral: Negative.     PHYSICAL EXAMINATION:    Pulse 73   Temp 98.1 F (36.7 C)   SpO2 100%      General appearance: alert, cooperative and appears stated age CVA: not tender Abdomen: soft, non-tender; non distended, no masses,  no organomegaly   ASSESSMENT Cystitis, symptoms didn't resolve with bactrim Hematuria with running in the last week.    PLAN Send urine for ua, c&s Treat with macrobid and pyridium If hematuria persists will refer to Urology

## 2019-07-15 LAB — URINALYSIS, MICROSCOPIC ONLY: Casts: NONE SEEN /lpf

## 2019-07-16 LAB — URINE CULTURE

## 2019-07-17 ENCOUNTER — Telehealth: Payer: Self-pay

## 2019-07-17 DIAGNOSIS — R310 Gross hematuria: Secondary | ICD-10-CM

## 2019-07-17 NOTE — Telephone Encounter (Signed)
Spoke with pt. Pt states seeing Mychart message and reviewed again results and recommendations per Dr Oscar La. Pt agreeable and verbalized understanding. Pt states feeling much better and has taken 3 days of Macrobid. Pt will stop taking Macrobid. Pt also ok to have urology consult for hemturia when running placed. Advised will review with Dr Oscar La for referral. Pt agreeable.   Routing to Dr Oscar La  Referral pended for Dr Arita Miss. Please advise.

## 2019-07-17 NOTE — Telephone Encounter (Signed)
-----   Message from Romualdo Bolk, MD sent at 07/16/2019  3:21 PM EDT ----- Please let the patient know that her repeat urine culture is negative for infection. She should stop her antibiotics, if she is still symptomatic we will refer her to Urology. She also needs referral to Urology if she continues to have hematuria after running.

## 2019-07-20 NOTE — Telephone Encounter (Signed)
Referral signed.

## 2019-11-11 ENCOUNTER — Encounter: Payer: Self-pay | Admitting: Family Medicine

## 2019-11-11 ENCOUNTER — Other Ambulatory Visit: Payer: Self-pay

## 2019-11-11 ENCOUNTER — Telehealth (INDEPENDENT_AMBULATORY_CARE_PROVIDER_SITE_OTHER): Payer: 59 | Admitting: Family Medicine

## 2019-11-11 DIAGNOSIS — N930 Postcoital and contact bleeding: Secondary | ICD-10-CM | POA: Diagnosis not present

## 2019-11-11 DIAGNOSIS — E781 Pure hyperglyceridemia: Secondary | ICD-10-CM

## 2019-11-11 DIAGNOSIS — K068 Other specified disorders of gingiva and edentulous alveolar ridge: Secondary | ICD-10-CM | POA: Diagnosis not present

## 2019-11-11 NOTE — Progress Notes (Signed)
   Virtual Visit via Video   I connected with patient on 11/11/19 at  1:00 PM EDT by a video enabled telemedicine application and verified that I am speaking with the correct person using two identifiers.  Location patient: Home Location provider: [facility], Office Persons participating in the virtual visit: Patient, Provider, CMA (Jess B)  I discussed the limitations of evaluation and management by telemedicine and the availability of in person appointments. The patient expressed understanding and agreed to proceed.  Subjective:   HPI:   Elevated triglycerides- pt's Trigs were 153 but they were collected at 1:47pm and pt wasn't fasting.  Bleeding after sex- pt has discussed this w/ GYN and was told she had a 'sensitive cervix'.  Pt reports red bleeding immediately after and then darker brown for 1-2 days after.  Very mild cramping.  sxs started ~3 yrs ago.  Pt has hx of chlamydia- tested earlier this year and was negative.  Continues to take birth control regularly.  Had normal pap recently.  Pt reports she will have bleeding gums w/ regular brushing.  ROS:   See pertinent positives and negatives per HPI.  Patient Active Problem List   Diagnosis Date Noted  . History of chlamydia   . Physical exam 10/11/2016  . COLLES' FRACTURE, RIGHT WRIST 01/05/2008    Social History   Tobacco Use  . Smoking status: Never Smoker  . Smokeless tobacco: Never Used  Substance Use Topics  . Alcohol use: Yes    Current Outpatient Medications:  .  Norethindrone Acetate-Ethinyl Estrad-FE (JUNEL FE 24) 1-20 MG-MCG(24) tablet, Take 1 tablet by mouth daily., Disp: 84 tablet, Rfl: 3  Allergies  Allergen Reactions  . Penicillins     Per mother, vomiting as a child    Objective:   There were no vitals taken for this visit. AAOx3, NAD NCAT, EOMI No obvious CN deficits Coloring WNL Pt is able to speak clearly, coherently without shortness of breath or increased work of breathing.    Thought process is linear.  Mood is appropriate.   Assessment and Plan:   Postcoital bleeding- new to provider, ongoing for pt x3 yrs.  UTD on pap.  Has had appropriate STD screening earlier this year but will repeat as she has hx of GC.  Check labs to assess for underlying causes of bleeding- liver dysfunction, platelet abnormality, Von Willebrands.  Pt expressed understanding and is in agreement w/ plan.   Bleeding gums- this raises the suspicion for Von Willebrands.  Particularly since this occurs w/ regular daily brushing.  Will get labs to assess.  Hypertriglyceridemia- reassurance provided that labs are fine at 153 non-fasting.  Neena Rhymes, MD 11/11/2019

## 2019-11-11 NOTE — Progress Notes (Signed)
I have discussed the procedure for the virtual visit with the patient who has given consent to proceed with assessment and treatment.   Pt unable to obtain vitals.   Moneka Mcquinn L Litzy Dicker, CMA     

## 2019-12-01 ENCOUNTER — Ambulatory Visit: Payer: 59

## 2019-12-09 ENCOUNTER — Other Ambulatory Visit (HOSPITAL_COMMUNITY)
Admission: RE | Admit: 2019-12-09 | Discharge: 2019-12-09 | Disposition: A | Discharge status: Home / Self Care | Payer: 59 | Source: Ambulatory Visit | Attending: Family Medicine | Admitting: Family Medicine

## 2019-12-09 ENCOUNTER — Ambulatory Visit (INDEPENDENT_AMBULATORY_CARE_PROVIDER_SITE_OTHER): Payer: 59

## 2019-12-09 ENCOUNTER — Other Ambulatory Visit: Payer: Self-pay

## 2019-12-09 DIAGNOSIS — K068 Other specified disorders of gingiva and edentulous alveolar ridge: Secondary | ICD-10-CM

## 2019-12-09 DIAGNOSIS — N930 Postcoital and contact bleeding: Secondary | ICD-10-CM | POA: Insufficient documentation

## 2019-12-09 LAB — CBC WITH DIFFERENTIAL/PLATELET
Basophils Absolute: 0.1 10*3/uL (ref 0.0–0.1)
Basophils Relative: 0.8 % (ref 0.0–3.0)
Eosinophils Absolute: 0.1 10*3/uL (ref 0.0–0.7)
Eosinophils Relative: 1.1 % (ref 0.0–5.0)
HCT: 41.2 % (ref 36.0–46.0)
Hemoglobin: 14 g/dL (ref 12.0–15.0)
Lymphocytes Relative: 38.4 % (ref 12.0–46.0)
Lymphs Abs: 3.4 10*3/uL (ref 0.7–4.0)
MCHC: 34 g/dL (ref 30.0–36.0)
MCV: 91.3 fl (ref 78.0–100.0)
Monocytes Absolute: 0.6 10*3/uL (ref 0.1–1.0)
Monocytes Relative: 6.4 % (ref 3.0–12.0)
Neutro Abs: 4.6 10*3/uL (ref 1.4–7.7)
Neutrophils Relative %: 53.3 % (ref 43.0–77.0)
Platelets: 503 10*3/uL — ABNORMAL HIGH (ref 150.0–400.0)
RBC: 4.52 Mil/uL (ref 3.87–5.11)
RDW: 11.8 % (ref 11.5–15.5)
WBC: 8.7 10*3/uL (ref 4.0–10.5)

## 2019-12-09 LAB — HEPATIC FUNCTION PANEL
ALT: 21 U/L (ref 0–35)
AST: 28 U/L (ref 0–37)
Albumin: 4.4 g/dL (ref 3.5–5.2)
Alkaline Phosphatase: 79 U/L (ref 39–117)
Bilirubin, Direct: 0.1 mg/dL (ref 0.0–0.3)
Total Bilirubin: 0.5 mg/dL (ref 0.2–1.2)
Total Protein: 7.7 g/dL (ref 6.0–8.3)

## 2019-12-09 LAB — BASIC METABOLIC PANEL
BUN: 13 mg/dL (ref 6–23)
CO2: 25 mEq/L (ref 19–32)
Calcium: 9.6 mg/dL (ref 8.4–10.5)
Chloride: 100 mEq/L (ref 96–112)
Creatinine, Ser: 0.72 mg/dL (ref 0.40–1.20)
GFR: 119.18 mL/min (ref >60.00)
Glucose, Bld: 82 mg/dL (ref 70–99)
Potassium: 3.6 mEq/L (ref 3.5–5.1)
Sodium: 135 mEq/L (ref 135–145)

## 2019-12-09 LAB — APTT: aPTT: 29.8 s (ref 23.4–32.7)

## 2019-12-09 LAB — PROTIME-INR
INR: 1 ratio (ref 0.8–1.0)
Prothrombin Time: 11.4 s (ref 9.6–13.1)

## 2019-12-10 LAB — URINE CYTOLOGY ANCILLARY ONLY
Bacterial Vaginitis-Urine: NEGATIVE
Candida Urine: POSITIVE — AB
Chlamydia: NEGATIVE
Comment: NEGATIVE
Comment: NEGATIVE
Comment: NORMAL
Neisseria Gonorrhea: NEGATIVE
Trichomonas: NEGATIVE

## 2019-12-11 ENCOUNTER — Other Ambulatory Visit: Payer: Self-pay | Admitting: General Practice

## 2019-12-11 MED ORDER — FLUCONAZOLE 150 MG PO TABS: 150.0000 mg | ORAL_TABLET | Freq: Once | ORAL | 0 refills | Status: AC

## 2019-12-11 MED ORDER — FLUCONAZOLE 150 MG PO TABS: 150.0000 mg | ORAL_TABLET | Freq: Once | ORAL | 0 refills | Status: DC

## 2019-12-14 LAB — VON WILLEBRAND PANEL
Factor-VIII Activity: 69 % normal (ref 50–180)
Ristocetin Co-Factor: 68 % normal (ref 42–200)
Von Willebrand Antigen, Plasma: 109 % (ref 50–217)
aPTT: 30 s (ref 23–32)

## 2019-12-17 ENCOUNTER — Encounter: Payer: Self-pay | Admitting: Family Medicine

## 2019-12-24 ENCOUNTER — Telehealth: Payer: Self-pay

## 2019-12-24 NOTE — Telephone Encounter (Signed)
AEX 01/2019 H/o post coital bleeding H/o chlamydia  LMP 10/18  Spoke with pt. Pt states having some hormone sx of hot flashes and night sweats and still having post coital bleeding. Pt states has seen PCP Dr Beverely Low and testing for Von Willebrand was negative. Pt states took home hormone testing at the beginning and end of Sept cycle and results said her estrogen was low. Pt states would like to come back in and be seen for OV to have proper hormone testing and figure out what is causing her symptoms. Pt states has monthly regular cycles and denies heavy bleeding, clots. States no FH of any hormone or bleeding imbalances.   Pt scheduled for OV with Dr Oscar La on 11/2 at 2 pm. Pt agreeable to date and time of appt.  Encounter closed

## 2019-12-24 NOTE — Telephone Encounter (Signed)
Patient is wanting to get hormone testing done.

## 2019-12-29 ENCOUNTER — Encounter: Payer: Self-pay | Admitting: Obstetrics and Gynecology

## 2019-12-29 ENCOUNTER — Other Ambulatory Visit: Payer: Self-pay

## 2019-12-29 ENCOUNTER — Ambulatory Visit (INDEPENDENT_AMBULATORY_CARE_PROVIDER_SITE_OTHER): Payer: 59 | Admitting: Obstetrics and Gynecology

## 2019-12-29 VITALS — BP 132/66 | HR 82 | Ht 60.0 in | Wt 124.0 lb

## 2019-12-29 DIAGNOSIS — R61 Generalized hyperhidrosis: Secondary | ICD-10-CM | POA: Diagnosis not present

## 2019-12-29 DIAGNOSIS — N930 Postcoital and contact bleeding: Secondary | ICD-10-CM

## 2019-12-29 DIAGNOSIS — R232 Flushing: Secondary | ICD-10-CM

## 2019-12-29 NOTE — Progress Notes (Signed)
GYNECOLOGY  VISIT   HPI: 19 y.o.   Single White or Caucasian Not Hispanic or Latino  female   G0P0000 with Patient's last menstrual period was 12/14/2019.   here to talk about her estrogen levels. She did an at home hormone test that indicated that her estrogen level is low.   Having hot flashes and night sweats since July. Notices it more closer to her cycle. Mostly at night, occasionally during the day. No tremor, no bowel change, some fatigue, some dry skin. She is on loestrin 24 fe.   Continues to have post coital bleeding. Normal pap in 5/21, negative STD testing. She mostly bleeds after sex when she is closer to her cycle. She will spot for 2 days after having sex.   Recent evaluation with her primary, normal PT/INR/PTT, normal BPM and LFT's,  negative evaluation of Von Willebrand's.   Urine evaluation from 2 weeks ago was + for candida, negative for GC/CT/Trich and BV.  Labs and office note reviewed.   GYNECOLOGIC HISTORY: Patient's last menstrual period was 12/14/2019. Contraception:opc Menopausal hormone therapy: none         OB History    Gravida  0   Para  0   Term  0   Preterm  0   AB  0   Living  0     SAB  0   TAB  0   Ectopic  0   Multiple  0   Live Births  0              Patient Active Problem List   Diagnosis Date Noted  . History of chlamydia   . Physical exam 10/11/2016  . COLLES' FRACTURE, RIGHT WRIST 01/05/2008    Past Medical History:  Diagnosis Date  . History of chlamydia   . STD (sexually transmitted disease)     Past Surgical History:  Procedure Laterality Date  . gum graft surgery  09/30/2017  . WISDOM TOOTH EXTRACTION      Current Outpatient Medications  Medication Sig Dispense Refill  . Norethindrone Acetate-Ethinyl Estrad-FE (JUNEL FE 24) 1-20 MG-MCG(24) tablet Take 1 tablet by mouth daily. 84 tablet 3   No current facility-administered medications for this visit.     ALLERGIES: Penicillins  Family History   Problem Relation Age of Onset  . Diabetes Mother        type 1  . Thyroid disease Mother        goiters  . Diabetes Brother 13       type 1  . Esophageal cancer Paternal Grandfather     Social History   Socioeconomic History  . Marital status: Single    Spouse name: Not on file  . Number of children: Not on file  . Years of education: Not on file  . Highest education level: Not on file  Occupational History  . Not on file  Tobacco Use  . Smoking status: Never Smoker  . Smokeless tobacco: Never Used  Vaping Use  . Vaping Use: Never used  Substance and Sexual Activity  . Alcohol use: Yes  . Drug use: No  . Sexual activity: Yes    Partners: Male    Birth control/protection: Condom, Pill  Other Topics Concern  . Not on file  Social History Narrative  . Not on file   Social Determinants of Health   Financial Resource Strain:   . Difficulty of Paying Living Expenses: Not on file  Food Insecurity:   .  Worried About Programme researcher, broadcasting/film/video in the Last Year: Not on file  . Ran Out of Food in the Last Year: Not on file  Transportation Needs:   . Lack of Transportation (Medical): Not on file  . Lack of Transportation (Non-Medical): Not on file  Physical Activity:   . Days of Exercise per Week: Not on file  . Minutes of Exercise per Session: Not on file  Stress:   . Feeling of Stress : Not on file  Social Connections:   . Frequency of Communication with Friends and Family: Not on file  . Frequency of Social Gatherings with Friends and Family: Not on file  . Attends Religious Services: Not on file  . Active Member of Clubs or Organizations: Not on file  . Attends Banker Meetings: Not on file  . Marital Status: Not on file  Intimate Partner Violence:   . Fear of Current or Ex-Partner: Not on file  . Emotionally Abused: Not on file  . Physically Abused: Not on file  . Sexually Abused: Not on file    Review of Systems  All other systems reviewed and are  negative.   PHYSICAL EXAMINATION:    BP 132/66   Pulse 82   Ht 5' (1.524 m)   Wt 124 lb (56.2 kg)   LMP 12/14/2019   SpO2 99%   BMI 24.22 kg/m     General appearance: alert, cooperative and appears stated age Neck: no adenopathy, supple, symmetrical, trachea midline and thyroid normal to inspection and palpation Abdomen: soft, non-tender; non distended, no masses,  no organomegaly  Pelvic: External genitalia:  no lesions              Urethra:  normal appearing urethra with no masses, tenderness or lesions              Bartholins and Skenes: normal                 Vagina: normal appearing vagina with normal color and discharge, no lesions              Cervix: no lesions and ectropion, mildly friable. Treated with silver nitrate.               Bimanual Exam:  Uterus:  normal size, contour, position, consistency, mobility, non-tender and anteverted              Adnexa: no mass, fullness, tenderness              Chaperone was present for exam.  ASSESSMENT Hot flashes, night sweats On OCP's Postcoital spotting, Pap normal this year, recent std testing    PLAN Check FSH, estradiol and TSH on the last day of her placebo pills Discussed that the postcoital spotting is not worrisome, could potentially improve if she came off of OCP's Discussed the possibility of another contraception Discussed condoms and emergency contraception Friable cervix treated with silver nitrate.    Over 30 minutes in total patient care.

## 2020-01-13 ENCOUNTER — Telehealth: Payer: Self-pay

## 2020-01-13 ENCOUNTER — Other Ambulatory Visit: Payer: Self-pay | Admitting: *Deleted

## 2020-01-13 DIAGNOSIS — R232 Flushing: Secondary | ICD-10-CM

## 2020-01-13 DIAGNOSIS — R61 Generalized hyperhidrosis: Secondary | ICD-10-CM

## 2020-01-13 NOTE — Telephone Encounter (Signed)
Patient would like an order for blood work fax to [facility] in [place]. Phone number is [phone].

## 2020-01-13 NOTE — Telephone Encounter (Signed)
Detailed message left with patient that labs had been released and patient could go to any [facility] to have drawn. Advised patient to return call to office if needs any additional assistance.   Confirmed with Lucy, Phlebotomist, that labs could be seen on [facility] side.   Routing to provider and will close encounter.

## 2020-01-15 ENCOUNTER — Other Ambulatory Visit: Payer: Self-pay | Admitting: *Deleted

## 2020-01-15 ENCOUNTER — Other Ambulatory Visit: Payer: Self-pay | Admitting: Obstetrics and Gynecology

## 2020-01-15 DIAGNOSIS — E230 Hypopituitarism: Secondary | ICD-10-CM

## 2020-01-15 LAB — ESTRADIOL: Estradiol: <5 pg/mL

## 2020-01-15 LAB — FOLLICLE STIMULATING HORMONE: FSH: 2.6 m[IU]/mL

## 2020-01-15 LAB — TSH: TSH: 1.47 u[IU]/mL (ref 0.450–4.500)

## 2020-01-19 LAB — GLIA (IGA/G) + TTG IGA
Antigliadin Abs, IgA: 6 units (ref 0–19)
Gliadin IgG: 3 units (ref 0–19)
Transglutaminase IgA: <2 U/mL (ref 0–3)

## 2020-01-26 ENCOUNTER — Telehealth: Payer: Self-pay

## 2020-01-26 DIAGNOSIS — R61 Generalized hyperhidrosis: Secondary | ICD-10-CM

## 2020-01-26 DIAGNOSIS — N3090 Cystitis, unspecified without hematuria: Secondary | ICD-10-CM

## 2020-01-26 DIAGNOSIS — R232 Flushing: Secondary | ICD-10-CM

## 2020-01-26 DIAGNOSIS — E230 Hypopituitarism: Secondary | ICD-10-CM

## 2020-01-26 NOTE — Telephone Encounter (Signed)
Routing to Hayley for precert. 

## 2020-01-26 NOTE — Telephone Encounter (Signed)
-----   Message from Romualdo Bolk, MD sent at 01/24/2020  5:03 PM EST ----- Please set her up for a brain MRI

## 2020-01-27 NOTE — Telephone Encounter (Signed)
Patient scheduled for brain MRI on 02/15/2020 at [facility]. Prior Authorization obtained and authorized. Encounter closed.

## 2020-02-15 ENCOUNTER — Ambulatory Visit
Admission: RE | Admit: 2020-02-15 | Discharge: 2020-02-15 | Disposition: A | Discharge status: Home / Self Care | Payer: 59 | Source: Ambulatory Visit | Attending: Obstetrics and Gynecology | Admitting: Obstetrics and Gynecology

## 2020-02-15 DIAGNOSIS — E230 Hypopituitarism: Secondary | ICD-10-CM

## 2020-02-15 MED ORDER — GADOBENATE DIMEGLUMINE 529 MG/ML IV SOLN
5.0000 mL | Freq: Once | INTRAVENOUS | Status: AC | PRN
  Administered 2020-02-15: 5 mL via INTRAVENOUS

## 2020-03-03 ENCOUNTER — Ambulatory Visit: Payer: 59 | Admitting: Sports Medicine

## 2020-03-08 ENCOUNTER — Encounter: Payer: Self-pay | Admitting: Obstetrics and Gynecology

## 2020-03-08 NOTE — Telephone Encounter (Addendum)
Dr.Jertson patient annual exam scheduled on 03/31/20 with you, requesting refill on Junel FE 1/20 Last annual exam 02/11/19.  I sent Rx to pharmacy.

## 2020-03-09 MED ORDER — JUNEL FE 24 1-20 MG-MCG(24) PO TABS
1.0000 | ORAL_TABLET | Freq: Every day | ORAL | 0 refills | Status: DC
Start: 2020-03-09 — End: 2021-06-05

## 2020-03-31 ENCOUNTER — Other Ambulatory Visit: Payer: Self-pay

## 2020-03-31 ENCOUNTER — Ambulatory Visit (INDEPENDENT_AMBULATORY_CARE_PROVIDER_SITE_OTHER): Payer: 59 | Admitting: Obstetrics and Gynecology

## 2020-03-31 ENCOUNTER — Encounter: Payer: Self-pay | Admitting: Obstetrics and Gynecology

## 2020-03-31 VITALS — BP 108/60 | HR 72 | Ht 60.5 in | Wt 124.0 lb

## 2020-03-31 DIAGNOSIS — E230 Hypopituitarism: Secondary | ICD-10-CM | POA: Diagnosis not present

## 2020-03-31 DIAGNOSIS — R232 Flushing: Secondary | ICD-10-CM

## 2020-03-31 DIAGNOSIS — Z3041 Encounter for surveillance of contraceptive pills: Secondary | ICD-10-CM | POA: Diagnosis not present

## 2020-03-31 DIAGNOSIS — Z113 Encounter for screening for infections with a predominantly sexual mode of transmission: Secondary | ICD-10-CM | POA: Diagnosis not present

## 2020-03-31 DIAGNOSIS — Z01419 Encounter for gynecological examination (general) (routine) without abnormal findings: Secondary | ICD-10-CM

## 2020-03-31 MED ORDER — LEVONORGEST-ETH ESTRAD 91-DAY 0.15-0.03 MG PO TABS: 1.0000 | ORAL_TABLET | Freq: Every day | ORAL | 4 refills | Status: DC

## 2020-03-31 NOTE — Progress Notes (Signed)
20 y.o. G0P0000 Single White or Caucasian Not Hispanic or Latino female here for an annual exam. Patient would like to discuss the contraception she is currently taking.  Period Cycle (Days): 28 Period Duration (Days): 4 Period Pattern: Regular Menstrual Flow: Moderate Menstrual Control: Tampon Menstrual Control Change Freq (Hours): 5-6 Dysmenorrhea: (!) Mild Dysmenorrhea Symptoms: Cramping   She was seen in 11/21 with hot flashes and post coital spotting. Her FSH and estradiol levels were low (on placebo pills). She had a negative celiac panel and a normal brain MRI. Hot flashes persist and are tolerable.   Not having frequent intercourse, partner with ED, no postcoital bleeding.   She is currently on an antibiotic and is worried about getting yeast. Slight irritation this am.     Patient's last menstrual period was 03/08/2020.          Sexually active: Yes.    The current method of family planning is OCP (estrogen/progesterone).    Exercising: Yes.    running and weights Smoker:  no  Health Maintenance: Pap:  07/07/19 Neg History of abnormal Pap:  no TDaP:  02/11/19 Gardasil: completed series   reports that she has never smoked. She has never used smokeless tobacco. She reports current alcohol use of about 2.0 standard drinks of alcohol per week. She reports that she does not use drugs. In her last semester at [employer], majoring in social work. Wants to get her masters. Working 3 part time jobs.   Past Medical History:  Diagnosis Date  . History of chlamydia   . STD (sexually transmitted disease)     Past Surgical History:  Procedure Laterality Date  . gum graft surgery  09/30/2017  . WISDOM TOOTH EXTRACTION      Current Outpatient Medications  Medication Sig Dispense Refill  . cefdinir (OMNICEF) 300 MG capsule Take 300 mg by mouth 2 (two) times daily.    . Norethindrone Acetate-Ethinyl Estrad-FE (JUNEL FE 24) 1-20 MG-MCG(24) tablet Take 1 tablet by mouth daily. 84  tablet 0   No current facility-administered medications for this visit.    Family History  Problem Relation Age of Onset  . Diabetes Mother        type 1  . Thyroid disease Mother        goiters  . Diabetes Brother 13       type 1  . Esophageal cancer Paternal Grandfather     Review of Systems  Constitutional: Negative.   HENT: Negative.   Eyes: Negative.   Respiratory: Negative.   Cardiovascular: Negative.   Gastrointestinal: Negative.   Endocrine: Negative.   Genitourinary: Negative.   Musculoskeletal: Negative.   Skin: Negative.   Allergic/Immunologic: Negative.   Neurological: Negative.   Hematological: Negative.   Psychiatric/Behavioral: Negative.     Exam:   BP 108/60 (BP Location: Left Arm, Patient Position: Sitting, Cuff Size: Normal)   Pulse 72   Ht 5' 0.5" (1.537 m)   Wt 124 lb (56.2 kg)   LMP 03/08/2020   BMI 23.82 kg/m   Weight change: @WEIGHTCHANGE @ Height:   Height: 5' 0.5" (153.7 cm)  Ht Readings from Last 3 Encounters:  03/31/20 5' 0.5" (1.537 m)  12/29/19 5' (1.524 m)  07/07/19 5' (1.524 m)    General appearance: alert, cooperative and appears stated age Head: Normocephalic, without obvious abnormality, atraumatic Neck: no adenopathy, supple, symmetrical, trachea midline and thyroid normal to inspection and palpation Lungs: clear to auscultation bilaterally Cardiovascular: regular rate and rhythm Breasts: normal  appearance, no masses or tenderness Abdomen: soft, non-tender; non distended,  no masses,  no organomegaly Extremities: extremities normal, atraumatic, no cyanosis or edema Skin: Skin color, texture, turgor normal. No rashes or lesions Lymph nodes: Cervical, supraclavicular, and axillary nodes normal. No abnormal inguinal nodes palpated Neurologic: Grossly normal   Pelvic: External genitalia:  no lesions              Urethra:  normal appearing urethra with no masses, tenderness or lesions              Bartholins and Skenes:  normal                 Vagina: normal appearing vagina with normal color and discharge, no lesions              Cervix: no lesions               Bimanual Exam:  Uterus:  normal size, contour, position, consistency, mobility, non-tender              Adnexa: no mass, fullness, tenderness               Rectovaginal: Confirms               Anus:  normal sphincter tone, no lesions  1. Well woman exam Discussed breast self exam Discussed calcium and vit D intake No pap this year  2. Encounter for surveillance of contraceptive pills Wants to change pills, still getting vasomotor symptoms, will increase to 30 mcg pill - levonorgestrel-ethinyl estradiol (SEASONALE) 0.15-0.03 MG tablet; Take 1 tablet by mouth daily.  Dispense: 91 tablet; Refill: 4  3. Screening examination for STD (sexually transmitted disease)  - RPR - HIV Antibody (routine testing w rflx) - SURESWAB CT/NG/T. vaginalis  4. Hypogonadotropic hypogonadism (HCC) Negative MRI Continue OCP's  5. Hot flashes Will increase from 20 to 30 mcg estrogen containing OCP

## 2020-03-31 NOTE — Patient Instructions (Signed)

## 2020-04-01 LAB — HIV ANTIBODY (ROUTINE TESTING W REFLEX): HIV 1&2 Ab, 4th Generation: NONREACTIVE

## 2020-04-01 LAB — RPR: RPR Ser Ql: NONREACTIVE

## 2020-04-05 LAB — SURESWAB CT/NG/T. VAGINALIS
C. trachomatis RNA, TMA: NOT DETECTED
N. gonorrhoeae RNA, TMA: NOT DETECTED
Trichomonas vaginalis RNA: NOT DETECTED

## 2020-07-07 ENCOUNTER — Telehealth: Payer: Self-pay

## 2020-07-07 NOTE — Telephone Encounter (Signed)
I have printed and emailed immunization report to patient. Patient called and informed that it has been emailed to look out for it and call us back if she does not receive it.

## 2020-07-07 NOTE — Telephone Encounter (Signed)
Patient is calling in wondering if she is able to get a signed copy of her immunization records for college admission, needing it as soon as possible.

## 2020-08-24 ENCOUNTER — Encounter: Payer: Self-pay | Admitting: *Deleted

## 2020-12-22 ENCOUNTER — Other Ambulatory Visit: Payer: Self-pay | Admitting: *Deleted

## 2020-12-22 DIAGNOSIS — Z3041 Encounter for surveillance of contraceptive pills: Secondary | ICD-10-CM

## 2020-12-22 MED ORDER — LEVONORGEST-ETH ESTRAD 91-DAY 0.15-0.03 MG PO TABS: 1.0000 | ORAL_TABLET | Freq: Every day | ORAL | 1 refills | Status: DC

## 2021-04-27 NOTE — Progress Notes (Deleted)
21 y.o. G0P0000 Single White or Caucasian Not Hispanic or Latino female here for annual exam.   ?  ? ?No LMP recorded.          ?Sexually active: {yes no:314532}  ?The current method of family planning is {contraception:315051}.    ?Exercising: {yes no:314532}  {types:19826} ?Smoker:  {YES NO:22349} ? ?Health Maintenance: ?Pap:  07/07/19 Neg ?History of abnormal Pap:  no ?MMG:  none  ?BMD:   none  ?Colonoscopy: none  ?TDaP:  02/11/19  ?Gardasil: complete  ? ? reports that she has never smoked. She has never used smokeless tobacco. She reports current alcohol use of about 2.0 standard drinks per week. She reports that she does not use drugs. ? ?Past Medical History:  ?Diagnosis Date  ? History of chlamydia   ? STD (sexually transmitted disease)   ? ? ?Past Surgical History:  ?Procedure Laterality Date  ? gum graft surgery  09/30/2017  ? WISDOM TOOTH EXTRACTION    ? ? ?Current Outpatient Medications  ?Medication Sig Dispense Refill  ? cefdinir (OMNICEF) 300 MG capsule Take 300 mg by mouth 2 (two) times daily.    ? levonorgestrel-ethinyl estradiol (SEASONALE) 0.15-0.03 MG tablet Take 1 tablet by mouth daily. 91 tablet 1  ? Norethindrone Acetate-Ethinyl Estrad-FE (JUNEL FE 24) 1-20 MG-MCG(24) tablet Take 1 tablet by mouth daily. 84 tablet 0  ? ?No current facility-administered medications for this visit.  ? ? ?Family History  ?Problem Relation Age of Onset  ? Diabetes Mother   ?     type 1  ? Thyroid disease Mother   ?     goiters  ? Diabetes Brother 13  ?     type 1  ? Esophageal cancer Paternal Grandfather   ? ? ?Review of Systems ? ?Exam:   ?There were no vitals taken for this visit.  Weight change: @WEIGHTCHANGE @ Height:      ?Ht Readings from Last 3 Encounters:  ?03/31/20 5' 0.5" (1.537 m)  ?12/29/19 5' (1.524 m)  ?07/07/19 5' (1.524 m)  ? ? ?General appearance: alert, cooperative and appears stated age ?Head: Normocephalic, without obvious abnormality, atraumatic ?Neck: no adenopathy, supple, symmetrical, trachea  midline and thyroid {CHL AMB PHY EX THYROID NORM DEFAULT:[phone]::"normal to inspection and palpation"} ?Lungs: clear to auscultation bilaterally ?Cardiovascular: regular rate and rhythm ?Breasts: {Exam; breast:13139::"normal appearance, no masses or tenderness"} ?Abdomen: soft, non-tender; non distended,  no masses,  no organomegaly ?Extremities: extremities normal, atraumatic, no cyanosis or edema ?Skin: Skin color, texture, turgor normal. No rashes or lesions ?Lymph nodes: Cervical, supraclavicular, and axillary nodes normal. ?No abnormal inguinal nodes palpated ?Neurologic: Grossly normal ? ? ?Pelvic: External genitalia:  no lesions ?             Urethra:  normal appearing urethra with no masses, tenderness or lesions ?             Bartholins and Skenes: normal    ?             Vagina: normal appearing vagina with normal color and discharge, no lesions ?             Cervix: {CHL AMB PHY EX CERVIX NORM DEFAULT:[phone]::"no lesions"} ?              ?Bimanual Exam:  Uterus:  {CHL AMB PHY EX UTERUS NORM DEFAULT:[phone]::"normal size, contour, position, consistency, mobility, non-tender"} ?             Adnexa: {CHL AMB PHY EX ADNEXA  NO MASS DEFAULT:[id number]::"no mass, fullness, tenderness"} ?              Rectovaginal: Confirms ?              Anus:  normal sphincter tone, no lesions ? ?*** chaperoned for the exam. ? ?A:  Well Woman with normal exam ? ?P:    ? ? ? ?

## 2021-05-04 ENCOUNTER — Ambulatory Visit: Payer: 59 | Admitting: Obstetrics and Gynecology

## 2021-06-01 ENCOUNTER — Other Ambulatory Visit: Payer: Self-pay | Admitting: Obstetrics and Gynecology

## 2021-06-01 DIAGNOSIS — Z3041 Encounter for surveillance of contraceptive pills: Secondary | ICD-10-CM

## 2021-06-05 NOTE — Telephone Encounter (Signed)
Last AEX 03/31/20--scheduled for 07/17/21.  ?

## 2021-07-10 NOTE — Progress Notes (Signed)
21 y.o. G0P0000 Single White or Caucasian Not Hispanic or Latino female here for annual exam.   H/O hypogonadotropic hypogonadism, negative MRI. On seasonique. Same long term partner, they are going to live together.  Period Cycle (Days): 90 Period Duration (Days): 5 Period Pattern: Regular Menstrual Flow: Moderate Menstrual Control: Tampon Menstrual Control Change Freq (Hours): 4 Dysmenorrhea: (!) Moderate Dysmenorrhea Symptoms: Cramping, Headache  Patient's last menstrual period was 07/01/2021.          Sexually active: Yes.    The current method of family planning is OCP (estrogen/progesterone).    Exercising: Yes.     Cardio  Smoker:  no  Health Maintenance: Pap:  07/07/19 Neg History of abnormal Pap:  no MMG:  none BMD:   none  Colonoscopy: none  TDaP:  02/11/2019 Gardasil: complete    reports that she has never smoked. She has never used smokeless tobacco. She reports current alcohol use of about 2.0 standard drinks per week. She reports that she does not use drugs. Rare ETOH. 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 Will move to [place] to live with her boyfriend.   Past Medical History:  Diagnosis Date   History of chlamydia    STD (sexually transmitted disease)     Past Surgical History:  Procedure Laterality Date   gum graft surgery  09/30/2017   WISDOM TOOTH EXTRACTION      Current Outpatient Medications  Medication Sig Dispense Refill   levonorgestrel-ethinyl estradiol (SEASONALE) 0.15-0.03 MG tablet TAKE 1 TABLET DAILY 91 tablet 0   No current facility-administered medications for this visit.    Family History  Problem Relation Age of Onset   Diabetes Mother        type 1   Thyroid disease Mother        goiters   Diabetes Brother 39       type 1   Esophageal cancer Paternal Grandfather     Review of Systems  All other systems reviewed and are  negative.  Exam:   BP 134/80   Pulse 88   Ht 5\' 1"  (1.549 m)   Wt 124 lb (56.2 kg)   LMP 07/01/2021   SpO2 100%   BMI 23.43 kg/m   Weight change: @WEIGHTCHANGE @ Height:   Height: 5\' 1"  (154.9 cm)  Ht Readings from Last 3 Encounters:  07/17/21 5\' 1"  (1.549 m)  03/31/20 5' 0.5" (1.537 m)  12/29/19 5' (1.524 m)    General appearance: alert, cooperative and appears stated age Head: Normocephalic, without obvious abnormality, atraumatic Neck: no adenopathy, supple, symmetrical, trachea midline and thyroid normal to inspection and palpation Lungs: clear to auscultation bilaterally Cardiovascular: regular rate and rhythm Breasts: normal appearance, no masses or tenderness Abdomen: soft, non-tender; non distended,  no masses,  no organomegaly Extremities: extremities normal, atraumatic, no cyanosis or edema Skin: Skin color, texture, turgor normal. No rashes or lesions Lymph nodes: Cervical, supraclavicular, and axillary nodes normal. No abnormal inguinal nodes palpated Neurologic: Grossly normal   Pelvic: External genitalia:  no lesions              Urethra:  normal appearing urethra with no masses, tenderness or lesions              Bartholins and Skenes: normal                 Vagina: normal appearing vagina with normal color and  discharge, no lesions              Cervix: no lesions               Bimanual Exam:  Uterus:  normal size, contour, position, consistency, mobility, non-tender              Adnexa: no mass, fullness, tenderness               Rectovaginal: Confirms               Anus:  normal sphincter tone, no lesions  Carolynn Serve chaperoned for the exam.  1. Well woman exam Discussed breast self exam Discussed calcium and vit D intake No pap this year Will check screening labs next year  2. Encounter for surveillance of contraceptive pills - levonorgestrel-ethinyl estradiol (SEASONALE) 0.15-0.03 MG tablet; Take 1 tablet by mouth daily.  Dispense: 91 tablet;  Refill: 3  3. Screening examination for STD (sexually transmitted disease) Declines blood work - SURESWAB CT/NG/T. vaginalis

## 2021-07-17 ENCOUNTER — Ambulatory Visit (INDEPENDENT_AMBULATORY_CARE_PROVIDER_SITE_OTHER): Payer: 59 | Admitting: Obstetrics and Gynecology

## 2021-07-17 ENCOUNTER — Encounter: Payer: Self-pay | Admitting: Obstetrics and Gynecology

## 2021-07-17 VITALS — BP 134/80 | HR 88 | Ht 61.0 in | Wt 124.0 lb

## 2021-07-17 DIAGNOSIS — Z113 Encounter for screening for infections with a predominantly sexual mode of transmission: Secondary | ICD-10-CM

## 2021-07-17 DIAGNOSIS — Z3041 Encounter for surveillance of contraceptive pills: Secondary | ICD-10-CM

## 2021-07-17 DIAGNOSIS — Z01419 Encounter for gynecological examination (general) (routine) without abnormal findings: Secondary | ICD-10-CM | POA: Diagnosis not present

## 2021-07-17 MED ORDER — LEVONORGEST-ETH ESTRAD 91-DAY 0.15-0.03 MG PO TABS: 1.0000 | ORAL_TABLET | Freq: Every day | ORAL | 3 refills | Status: DC

## 2021-07-21 LAB — SURESWAB CT/NG/T. VAGINALIS
C. trachomatis RNA, TMA: NOT DETECTED
N. gonorrhoeae RNA, TMA: NOT DETECTED
Trichomonas vaginalis RNA: NOT DETECTED

## 2022-01-03 ENCOUNTER — Telehealth: Payer: Self-pay | Admitting: *Deleted

## 2022-01-03 DIAGNOSIS — Z3041 Encounter for surveillance of contraceptive pills: Secondary | ICD-10-CM

## 2022-01-03 MED ORDER — LEVONORGEST-ETH ESTRAD 91-DAY 0.15-0.03 MG PO TABS: 1.0000 | ORAL_TABLET | Freq: Every day | ORAL | 1 refills | Status: DC

## 2022-01-03 NOTE — Telephone Encounter (Signed)
Patient called has moved to [place], [place] report she needs her Rx sent to [facility], no longer using [facility].  Last annual exam was 04/2021, Rx sent.

## 2022-05-29 IMAGING — MR MR HEAD WO/W CM
16 of 19 series · 36 of 48 positions shown · IV contrast (5 ml multihance)
Comparison: Head CT December 24, 2006

CLINICAL DATA: Hypogonadotropic hypogonadism.

EXAM:
MRI HEAD WITHOUT AND WITH CONTRAST
TECHNIQUE: Multiplanar, multiecho pulse sequences of the brain and surrounding
structures were obtained without and with intravenous contrast.
CONTRAST:  5mL MULTIHANCE GADOBENATE DIMEGLUMINE 529 MG/ML IV SOLN

[Series 2: T1 · sagittal · 5.0mm · 0.45mm/px · 3 of 23 slices shown]
[im 1/23]
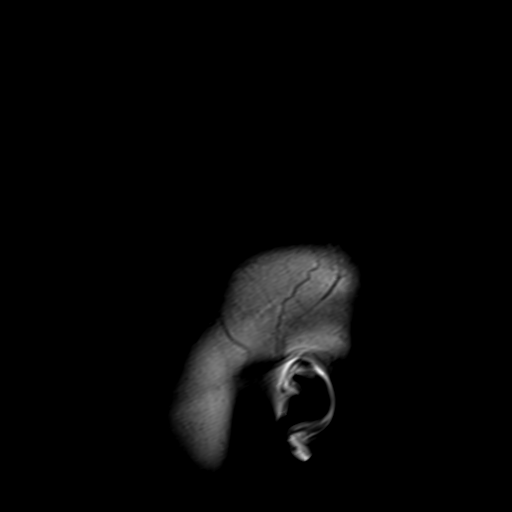
[im 12/23]
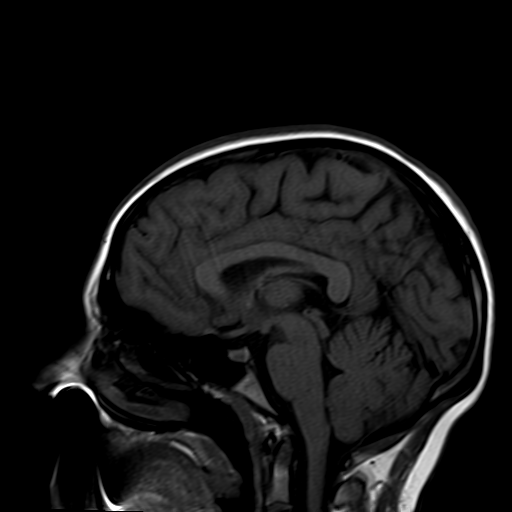
[im 23/23]
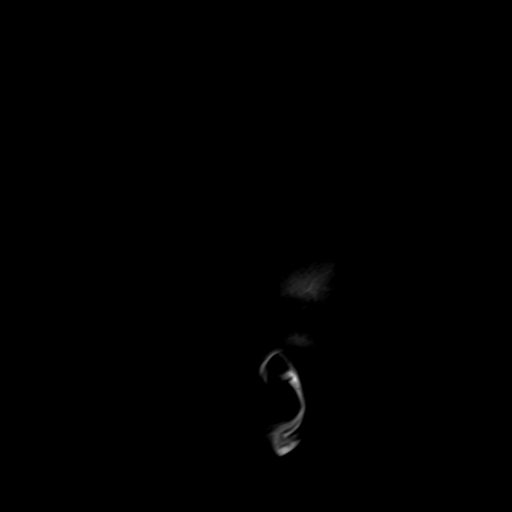

[Series 3: DWI · axial · 3.0mm · 1.80mm/px · z∈[-25,+102]mm · 9 of 92 slices shown]
[im 1/92]
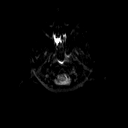
[im 12/92]
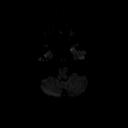
[im 23/92]
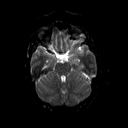
[im 35/92]
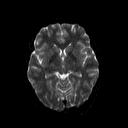
[im 46/92]
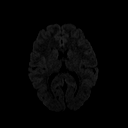
[im 57/92]
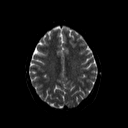
[im 69/92]
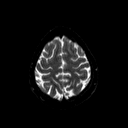
[im 80/92]
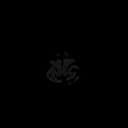
[im 92/92]
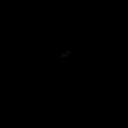

[Series 4: dwi_adc · axial · 3.0mm · 1.80mm/px · z∈[-25,+102]mm · 5 of 46 slices shown]
[im 1/46]
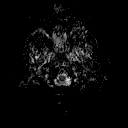
[im 12/46]
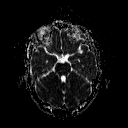
[im 23/46]
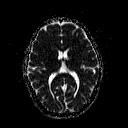
[im 34/46]
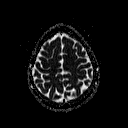
[im 46/46]
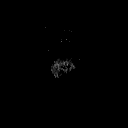

[Series 5: T2 · axial · 5.0mm · 0.36mm/px · z∈[-73,+76]mm · 2 of 24 slices shown]
[im 1/24]
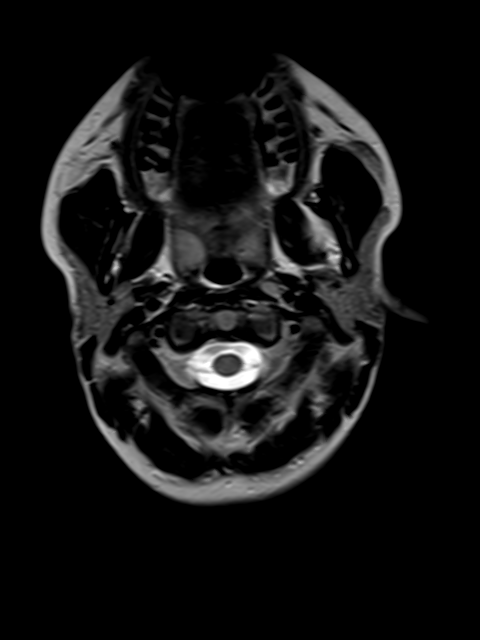
[im 24/24]
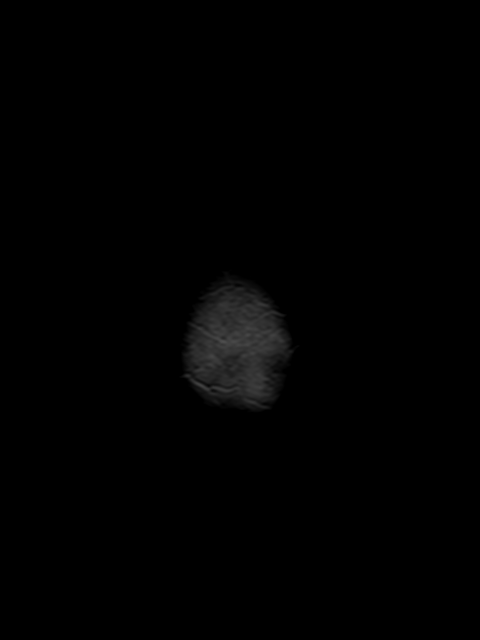

[Series 6: FLAIR · axial · 3.0mm · 0.45mm/px · z∈[-73,+75]mm · 3 of 33 slices shown]
[im 1/33]
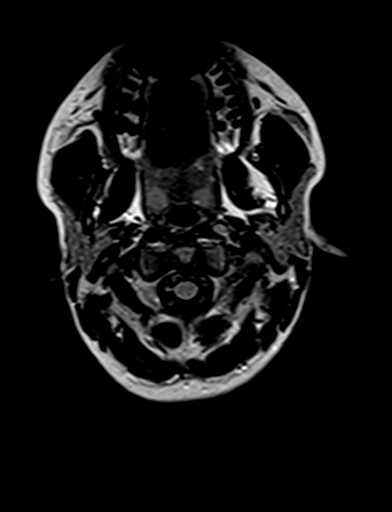
[im 17/33]
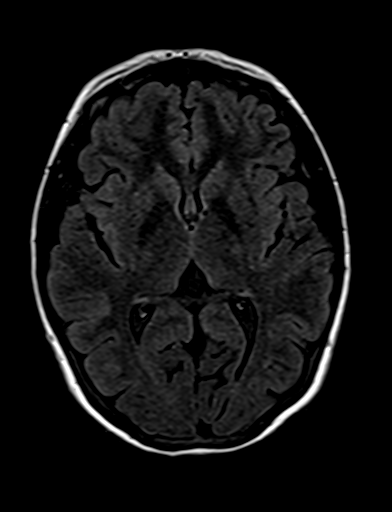
[im 33/33]
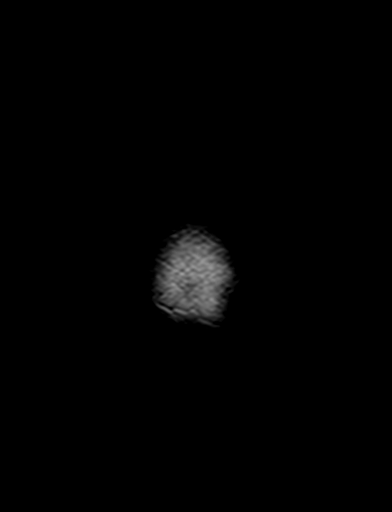

[Series 8: swi_images · axial · 4.0mm · 0.94mm/px · z∈[-66,+73]mm · 4 of 36 slices shown]
[im 1/36]
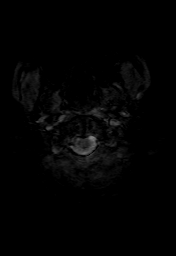
[im 12/36]
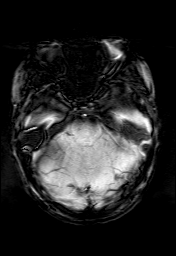
[im 24/36]
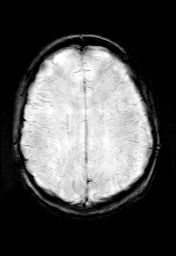
[im 36/36]
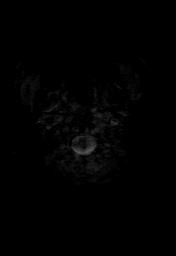

[Series 9: sag 3mm · sagittal · 3.0mm · 0.33mm/px · 1 of 11 slices shown]
[im 1/11]
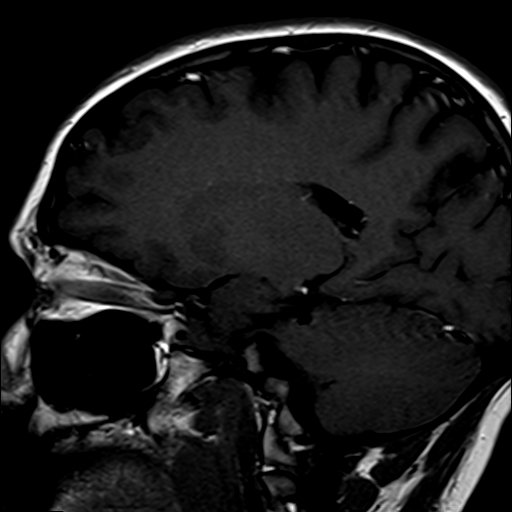

[Series 10: cor 3mm · coronal · 3.0mm · 0.33mm/px · 1 of 11 slices shown]
[im 1/11]
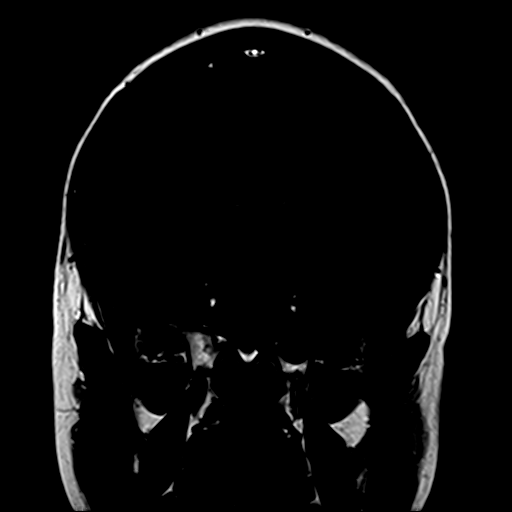

[Series 11: pre cor dynamic · coronal · non-contrast · 3.0mm · 0.35mm/px · 1 of 9 slices shown]
[im 1/9]
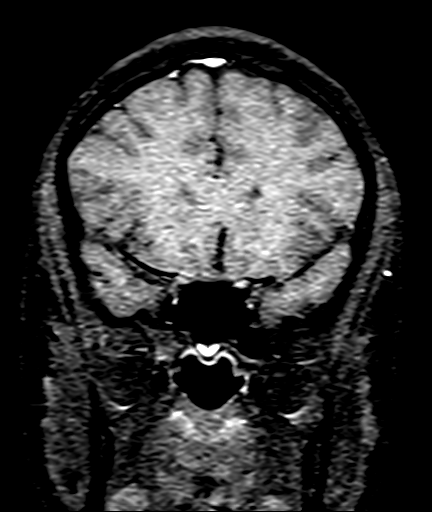

[Series 12: post fs cor · coronal · 3.0mm · 0.35mm/px · 1 of 9 slices shown (1 of 6)]
[im 1/9]
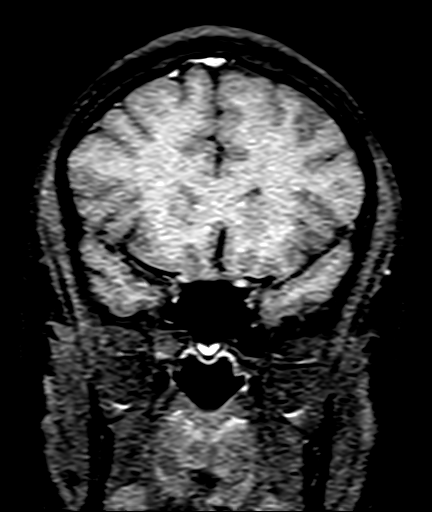

[Series 13: post fs cor · coronal · 3.0mm · 0.35mm/px · 1 of 9 slices shown (2 of 6)]
[im 1/9]
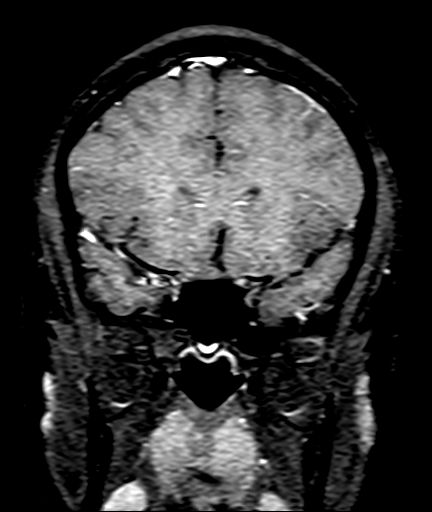

[Series 14: post fs cor · coronal · 3.0mm · 0.35mm/px · 1 of 9 slices shown (3 of 6)]
[im 1/9]
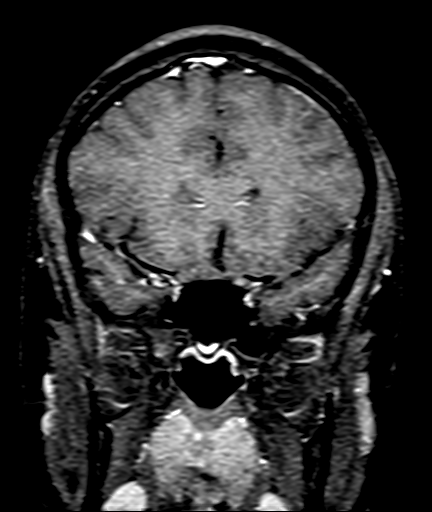

[Series 15: post fs cor · coronal · 3.0mm · 0.35mm/px · 1 of 9 slices shown (4 of 6)]
[im 1/9]
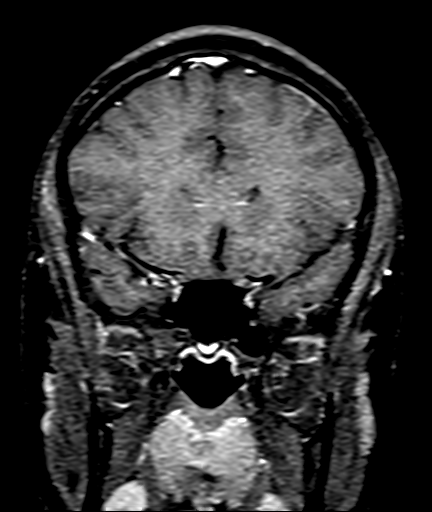

[Series 16: post fs cor · coronal · 3.0mm · 0.35mm/px · 1 of 9 slices shown (5 of 6)]
[im 1/9]
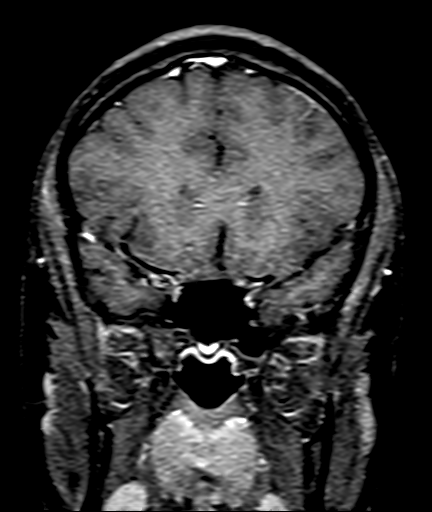

[Series 17: post fs cor · coronal · 3.0mm · 0.35mm/px · 1 of 9 slices shown (6 of 6)]
[im 1/9]
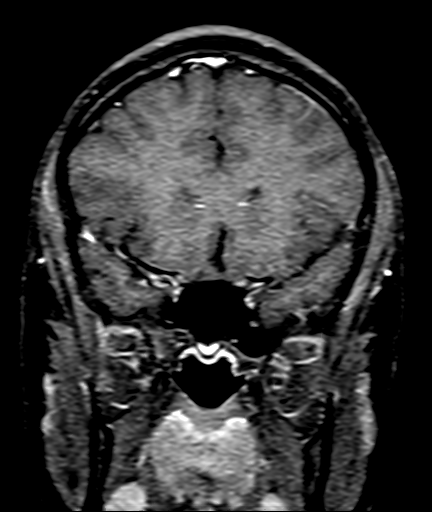

[Series 18: post sag 3mm · sagittal · 3.0mm · 0.33mm/px · 1 of 11 slices shown]
[im 1/11]
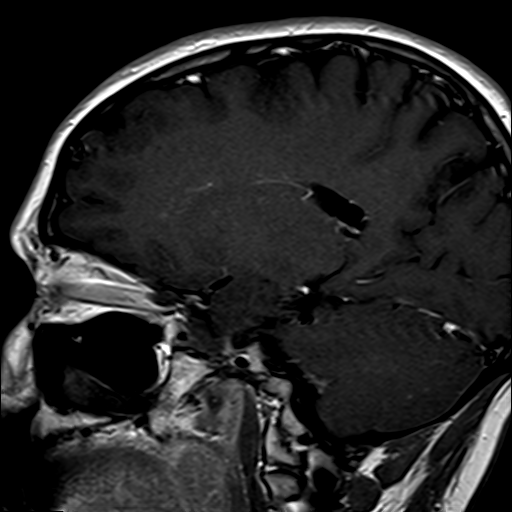

[36 of 48 positions shown; findings below may reference images not displayed]

FINDINGS: Brain: No acute infarction, hemorrhage, hydrocephalus, extra-axial
collection or mass lesion.

The brain parenchyma has normal morphology and signal
characteristics.

Pituitary/Sella: The pituitary gland is normal in appearance without
mass lesion. A normal posterior pituitary bright spot is seen. The
infundibulum is midline. The hypothalamus and mamillary bodies are
normal. There is no mass effect on the optic chiasm or optic nerves.
The infundibular and chiasmatic recesses are clear. Normal cavernous
sinus and cavernous internal carotid artery flow voids.

Vascular: Normal flow voids.

Skull and upper cervical spine: Normal marrow signal.

Sinuses/Orbits: Negative.
IMPRESSION: Normal MRI of the brain and pituitary gland.

## 2022-07-09 ENCOUNTER — Other Ambulatory Visit: Payer: Self-pay | Admitting: *Deleted

## 2022-07-09 DIAGNOSIS — Z3041 Encounter for surveillance of contraceptive pills: Secondary | ICD-10-CM

## 2022-07-09 NOTE — Telephone Encounter (Signed)
Med refill request:OCP Seasonale Last AEX: 07/17/21 Next AEX: Not scheduled Last MMG (if hormonal med)N/A   Patient left message requesting refill of OCP until she sees new provider on 08/26/22.   Refill authorized: Please Advise? Rx pended

## 2022-07-10 MED ORDER — LEVONORGEST-ETH ESTRAD 91-DAY 0.15-0.03 MG PO TABS: 1.0000 | ORAL_TABLET | Freq: Every day | ORAL | 0 refills | Status: AC
